# Patient Record
Sex: Male | Born: 1950 | Race: White | Hispanic: No | Marital: Married | Smoking: Never smoker
Health system: Southern US, Community
[De-identification: ages and names within clinical notes are randomized; demographics above are authoritative.]

## PROBLEM LIST (undated history)

## (undated) DIAGNOSIS — K635 Polyp of colon: Secondary | ICD-10-CM

## (undated) DIAGNOSIS — K5790 Diverticulosis of intestine, part unspecified, without perforation or abscess without bleeding: Secondary | ICD-10-CM

## (undated) DIAGNOSIS — D126 Benign neoplasm of colon, unspecified: Secondary | ICD-10-CM

## (undated) DIAGNOSIS — C801 Malignant (primary) neoplasm, unspecified: Secondary | ICD-10-CM

## (undated) DIAGNOSIS — Z85038 Personal history of other malignant neoplasm of large intestine: Principal | ICD-10-CM

## (undated) DIAGNOSIS — K648 Other hemorrhoids: Secondary | ICD-10-CM

## (undated) HISTORY — DX: Polyp of colon: K63.5

## (undated) HISTORY — PX: PILONIDAL CYST EXCISION: SHX744

## (undated) HISTORY — PX: UPPER GASTROINTESTINAL ENDOSCOPY: SHX188

## (undated) HISTORY — PX: PILONIDAL CYST / SINUS EXCISION: SUR543

## (undated) HISTORY — DX: Personal history of other malignant neoplasm of large intestine: Z85.038

## (undated) HISTORY — PX: APPENDECTOMY: SHX54

## (undated) HISTORY — DX: Malignant (primary) neoplasm, unspecified: C80.1

## (undated) HISTORY — DX: Diverticulosis of intestine, part unspecified, without perforation or abscess without bleeding: K57.90

## (undated) HISTORY — DX: Other hemorrhoids: K64.8

## (undated) HISTORY — PX: POLYPECTOMY: SHX149

## (undated) HISTORY — PX: TONSILLECTOMY AND ADENOIDECTOMY: SUR1326

## (undated) HISTORY — DX: Benign neoplasm of colon, unspecified: D12.6

---

## 1999-04-11 ENCOUNTER — Encounter: Payer: Self-pay | Admitting: Gastroenterology

## 1999-04-11 ENCOUNTER — Ambulatory Visit (HOSPITAL_COMMUNITY): Admission: RE | Admit: 1999-04-11 | Discharge: 1999-04-11 | Payer: Self-pay | Admitting: Gastroenterology

## 2004-11-02 HISTORY — PX: COLON RESECTION: SHX5231

## 2004-11-02 HISTORY — PX: COLONOSCOPY: SHX174

## 2005-01-13 ENCOUNTER — Ambulatory Visit: Payer: Self-pay | Admitting: Gastroenterology

## 2005-01-30 ENCOUNTER — Ambulatory Visit: Payer: Self-pay | Admitting: Gastroenterology

## 2005-02-03 ENCOUNTER — Ambulatory Visit: Payer: Self-pay | Admitting: Cardiology

## 2005-02-20 ENCOUNTER — Encounter (INDEPENDENT_AMBULATORY_CARE_PROVIDER_SITE_OTHER): Payer: Self-pay | Admitting: Specialist

## 2005-02-20 ENCOUNTER — Inpatient Hospital Stay (HOSPITAL_COMMUNITY): Admission: RE | Admit: 2005-02-20 | Discharge: 2005-02-24 | Payer: Self-pay | Admitting: General Surgery

## 2005-02-25 ENCOUNTER — Ambulatory Visit: Payer: Self-pay | Admitting: Oncology

## 2005-02-26 ENCOUNTER — Inpatient Hospital Stay (HOSPITAL_COMMUNITY): Admission: EM | Admit: 2005-02-26 | Discharge: 2005-02-28 | Payer: Self-pay | Admitting: Emergency Medicine

## 2005-03-25 ENCOUNTER — Ambulatory Visit (HOSPITAL_COMMUNITY): Admission: RE | Admit: 2005-03-25 | Discharge: 2005-03-25 | Payer: Self-pay | Admitting: General Surgery

## 2005-04-13 ENCOUNTER — Ambulatory Visit: Payer: Self-pay | Admitting: Oncology

## 2005-06-05 ENCOUNTER — Ambulatory Visit: Payer: Self-pay | Admitting: Oncology

## 2005-07-21 ENCOUNTER — Ambulatory Visit: Payer: Self-pay | Admitting: Oncology

## 2005-09-10 ENCOUNTER — Ambulatory Visit: Payer: Self-pay | Admitting: Oncology

## 2005-09-22 ENCOUNTER — Ambulatory Visit (HOSPITAL_COMMUNITY): Admission: RE | Admit: 2005-09-22 | Discharge: 2005-09-22 | Payer: Self-pay | Admitting: Oncology

## 2005-12-23 ENCOUNTER — Ambulatory Visit: Payer: Self-pay | Admitting: Gastroenterology

## 2006-01-06 ENCOUNTER — Ambulatory Visit: Payer: Self-pay | Admitting: Gastroenterology

## 2006-01-21 ENCOUNTER — Ambulatory Visit: Payer: Self-pay | Admitting: Oncology

## 2006-01-25 ENCOUNTER — Ambulatory Visit (HOSPITAL_COMMUNITY): Admission: RE | Admit: 2006-01-25 | Discharge: 2006-01-25 | Payer: Self-pay | Admitting: Oncology

## 2006-05-13 ENCOUNTER — Ambulatory Visit: Payer: Self-pay | Admitting: Oncology

## 2006-05-19 LAB — COMPREHENSIVE METABOLIC PANEL
AST: 19 U/L (ref 0–37)
Alkaline Phosphatase: 83 U/L (ref 39–117)
BUN: 13 mg/dL (ref 6–23)
Calcium: 9.2 mg/dL (ref 8.4–10.5)
Chloride: 104 mEq/L (ref 96–112)
Creatinine, Ser: 1.13 mg/dL (ref 0.40–1.50)
Total Bilirubin: 0.8 mg/dL (ref 0.3–1.2)

## 2006-05-19 LAB — LACTATE DEHYDROGENASE: LDH: 165 U/L (ref 94–250)

## 2006-05-19 LAB — CBC WITH DIFFERENTIAL/PLATELET
Basophils Absolute: 0 10*3/uL (ref 0.0–0.1)
EOS%: 1.1 % (ref 0.0–7.0)
HCT: 46.1 % (ref 38.7–49.9)
HGB: 15.9 g/dL (ref 13.0–17.1)
LYMPH%: 31.8 % (ref 14.0–48.0)
MCH: 29.9 pg (ref 28.0–33.4)
MCHC: 34.6 g/dL (ref 32.0–35.9)
MCV: 86.6 fL (ref 81.6–98.0)
MONO%: 8.9 % (ref 0.0–13.0)
NEUT%: 57.8 % (ref 40.0–75.0)
Platelets: 164 10*3/uL (ref 145–400)

## 2006-05-21 ENCOUNTER — Ambulatory Visit (HOSPITAL_COMMUNITY): Admission: RE | Admit: 2006-05-21 | Discharge: 2006-05-21 | Payer: Self-pay | Admitting: Oncology

## 2006-09-21 ENCOUNTER — Ambulatory Visit: Payer: Self-pay | Admitting: Oncology

## 2006-09-24 LAB — CBC WITH DIFFERENTIAL/PLATELET
Basophils Absolute: 0 10*3/uL (ref 0.0–0.1)
EOS%: 3.3 % (ref 0.0–7.0)
Eosinophils Absolute: 0.2 10*3/uL (ref 0.0–0.5)
HGB: 15.9 g/dL (ref 13.0–17.1)
MCH: 29.9 pg (ref 28.0–33.4)
NEUT#: 3.6 10*3/uL (ref 1.5–6.5)
RBC: 5.3 10*6/uL (ref 4.20–5.71)
RDW: 13.2 % (ref 11.2–14.6)
lymph#: 1.8 10*3/uL (ref 0.9–3.3)

## 2006-09-25 LAB — COMPREHENSIVE METABOLIC PANEL
AST: 20 U/L (ref 0–37)
Albumin: 4.5 g/dL (ref 3.5–5.2)
Alkaline Phosphatase: 87 U/L (ref 39–117)
BUN: 19 mg/dL (ref 6–23)
Calcium: 9.5 mg/dL (ref 8.4–10.5)
Chloride: 104 mEq/L (ref 96–112)
Potassium: 4.4 mEq/L (ref 3.5–5.3)
Sodium: 142 mEq/L (ref 135–145)
Total Protein: 6.7 g/dL (ref 6.0–8.3)

## 2006-09-27 ENCOUNTER — Ambulatory Visit (HOSPITAL_COMMUNITY): Admission: RE | Admit: 2006-09-27 | Discharge: 2006-09-27 | Payer: Self-pay | Admitting: Oncology

## 2006-12-30 ENCOUNTER — Ambulatory Visit: Payer: Self-pay | Admitting: Gastroenterology

## 2007-01-27 ENCOUNTER — Ambulatory Visit: Payer: Self-pay | Admitting: Oncology

## 2007-02-01 LAB — CBC WITH DIFFERENTIAL/PLATELET
BASO%: 1.5 % (ref 0.0–2.0)
Eosinophils Absolute: 0.1 10*3/uL (ref 0.0–0.5)
MONO#: 0.5 10*3/uL (ref 0.1–0.9)
NEUT#: 2.7 10*3/uL (ref 1.5–6.5)
Platelets: 168 10*3/uL (ref 145–400)
RBC: 5.5 10*6/uL (ref 4.20–5.71)
RDW: 13.7 % (ref 11.2–14.6)
WBC: 5.2 10*3/uL (ref 4.0–10.0)
lymph#: 1.9 10*3/uL (ref 0.9–3.3)

## 2007-02-01 LAB — CEA: CEA: <0.5 ng/mL (ref 0.0–5.0)

## 2007-02-01 LAB — COMPREHENSIVE METABOLIC PANEL
ALT: 26 U/L (ref 0–53)
Albumin: 4.6 g/dL (ref 3.5–5.2)
CO2: 27 mEq/L (ref 19–32)
Chloride: 103 mEq/L (ref 96–112)
Glucose, Bld: 106 mg/dL — ABNORMAL HIGH (ref 70–99)
Potassium: 4.2 mEq/L (ref 3.5–5.3)
Sodium: 141 mEq/L (ref 135–145)
Total Protein: 7.1 g/dL (ref 6.0–8.3)

## 2007-02-01 LAB — LACTATE DEHYDROGENASE: LDH: 161 U/L (ref 94–250)

## 2007-02-04 ENCOUNTER — Ambulatory Visit (HOSPITAL_COMMUNITY): Admission: RE | Admit: 2007-02-04 | Discharge: 2007-02-04 | Payer: Self-pay | Admitting: Oncology

## 2007-02-11 IMAGING — CR DG CHEST 2V
2 series · 2 of 2 positions shown · non-contrast
Comparison: None

CLINICAL DATA: Colon cancer, preop for colon resection

CHEST - 2 VIEW:

[view not recorded (1 of 2)]
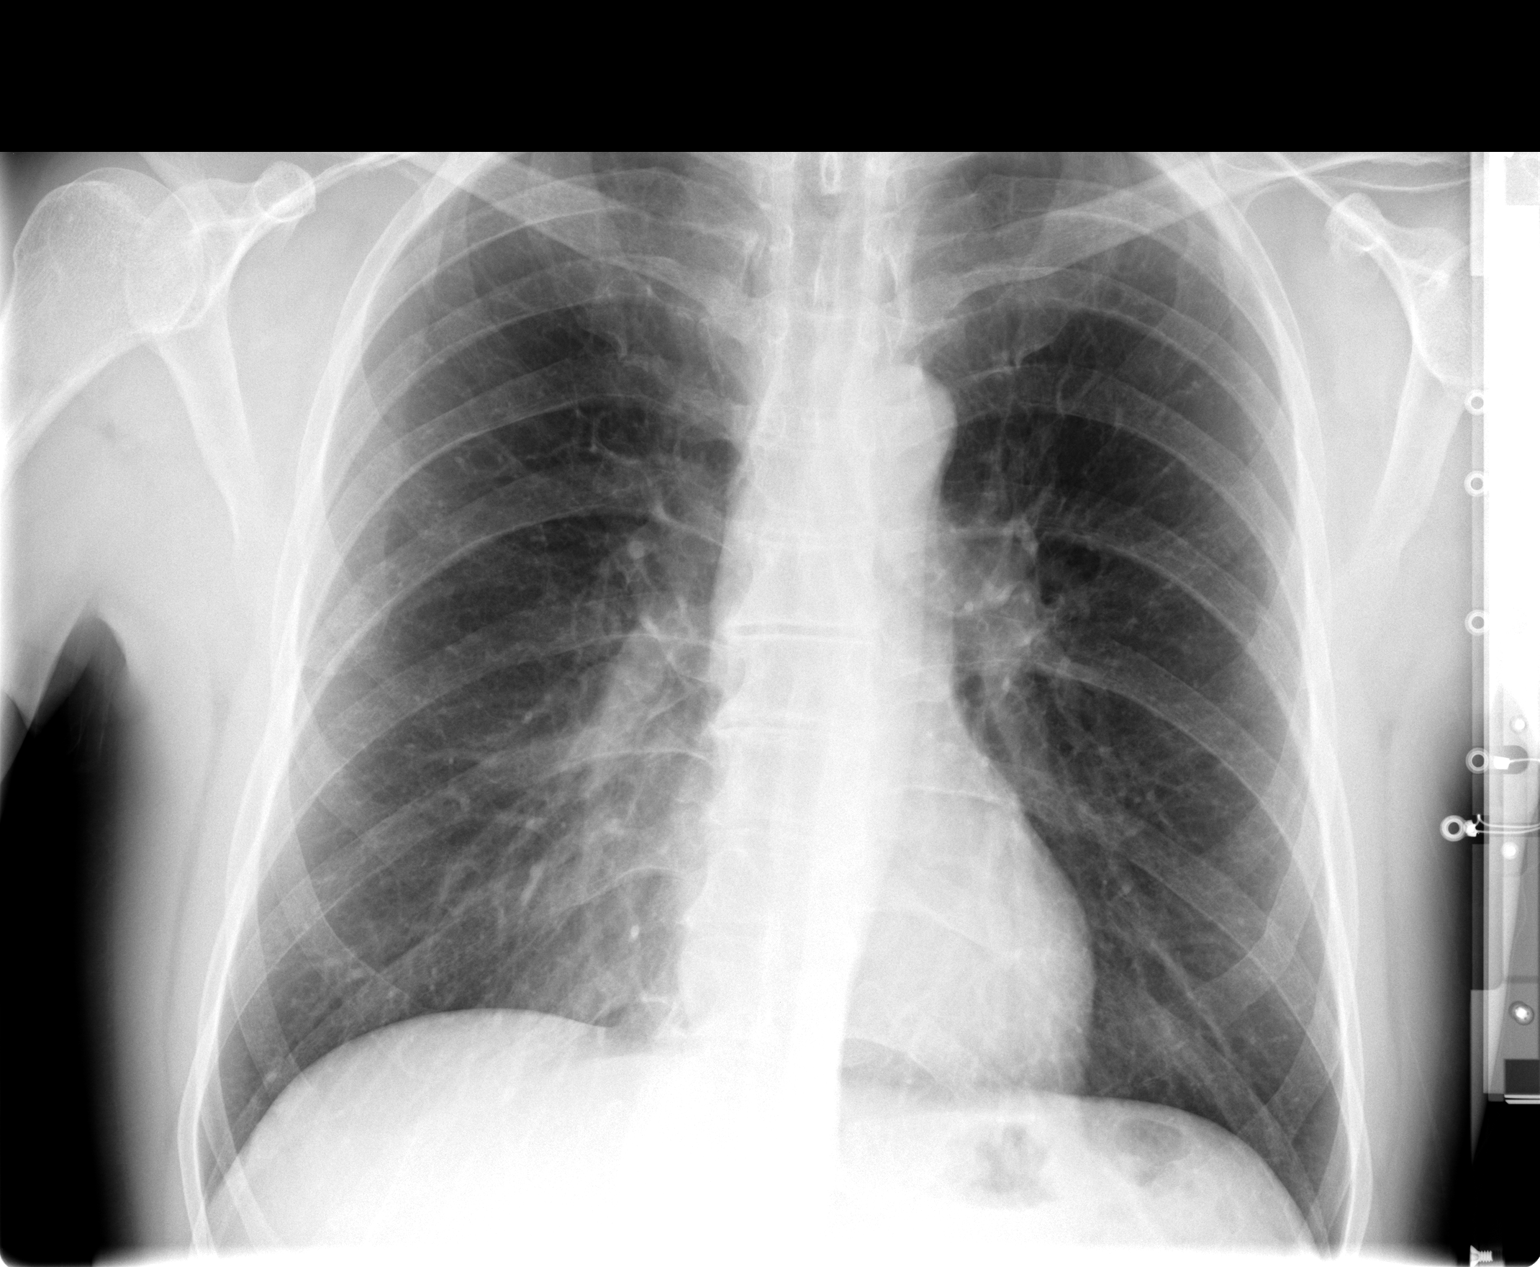

[view not recorded (2 of 2)]
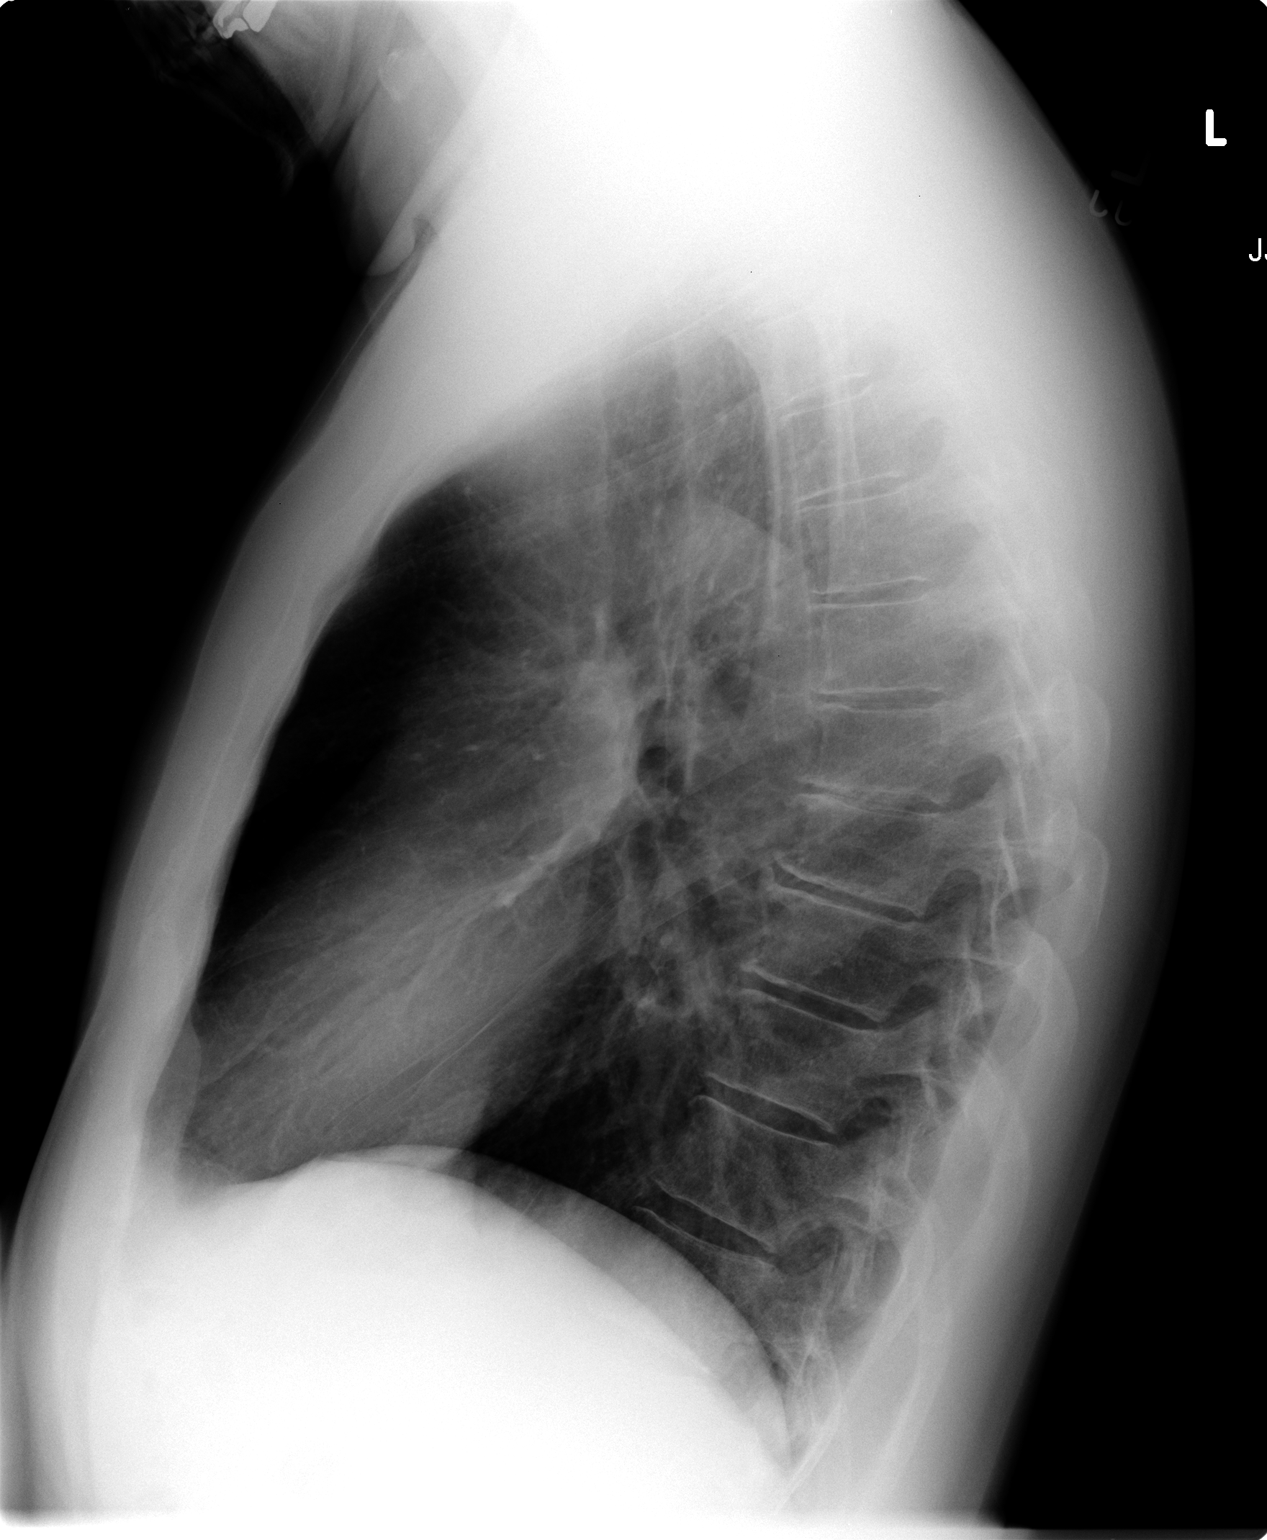

[2 of 2 positions shown; findings below may reference images not displayed]

FINDINGS: The heart size and mediastinal contours are within normal limits. 
Both lungs are clear.  The visualized skeletal structures are unremarkable.
IMPRESSION: No active cardiopulmonary disease

## 2007-06-02 ENCOUNTER — Ambulatory Visit: Payer: Self-pay | Admitting: Oncology

## 2007-06-06 LAB — CBC WITH DIFFERENTIAL/PLATELET
BASO%: 0.4 % (ref 0.0–2.0)
EOS%: 2.7 % (ref 0.0–7.0)
MCH: 30.3 pg (ref 28.0–33.4)
MCHC: 35.3 g/dL (ref 32.0–35.9)
MCV: 85.9 fL (ref 81.6–98.0)
MONO%: 8.6 % (ref 0.0–13.0)
NEUT%: 53.1 % (ref 40.0–75.0)
RDW: 13.6 % (ref 11.2–14.6)
lymph#: 1.8 10*3/uL (ref 0.9–3.3)

## 2007-06-06 LAB — COMPREHENSIVE METABOLIC PANEL
Albumin: 4.3 g/dL (ref 3.5–5.2)
BUN: 11 mg/dL (ref 6–23)
Calcium: 9.5 mg/dL (ref 8.4–10.5)
Chloride: 107 mEq/L (ref 96–112)
Creatinine, Ser: 1.02 mg/dL (ref 0.40–1.50)
Glucose, Bld: 99 mg/dL (ref 70–99)
Potassium: 3.9 mEq/L (ref 3.5–5.3)

## 2007-06-06 LAB — CEA: CEA: <0.5 ng/mL (ref 0.0–5.0)

## 2007-06-07 ENCOUNTER — Ambulatory Visit (HOSPITAL_COMMUNITY): Admission: RE | Admit: 2007-06-07 | Discharge: 2007-06-07 | Payer: Self-pay | Admitting: Oncology

## 2007-12-01 ENCOUNTER — Ambulatory Visit: Payer: Self-pay | Admitting: Oncology

## 2007-12-05 LAB — COMPREHENSIVE METABOLIC PANEL
ALT: 20 U/L (ref 0–53)
AST: 21 U/L (ref 0–37)
Albumin: 4.3 g/dL (ref 3.5–5.2)
Alkaline Phosphatase: 89 U/L (ref 39–117)
Calcium: 9.4 mg/dL (ref 8.4–10.5)
Glucose, Bld: 110 mg/dL — ABNORMAL HIGH (ref 70–99)
Potassium: 4.2 mEq/L (ref 3.5–5.3)
Sodium: 143 mEq/L (ref 135–145)
Total Bilirubin: 0.9 mg/dL (ref 0.3–1.2)

## 2007-12-05 LAB — CBC WITH DIFFERENTIAL/PLATELET
BASO%: 0.6 % (ref 0.0–2.0)
EOS%: 2.4 % (ref 0.0–7.0)
LYMPH%: 26 % (ref 14.0–48.0)
MCHC: 35.1 g/dL (ref 32.0–35.9)
MCV: 85 fL (ref 81.6–98.0)
MONO%: 7.1 % (ref 0.0–13.0)
Platelets: 156 10*3/uL (ref 145–400)
RBC: 5.45 10*6/uL (ref 4.20–5.71)
RDW: 13.1 % (ref 11.2–14.6)

## 2007-12-05 LAB — CEA: CEA: <0.5 ng/mL (ref 0.0–5.0)

## 2007-12-07 ENCOUNTER — Ambulatory Visit (HOSPITAL_COMMUNITY): Admission: RE | Admit: 2007-12-07 | Discharge: 2007-12-07 | Payer: Self-pay | Admitting: Oncology

## 2008-01-09 ENCOUNTER — Ambulatory Visit: Payer: Self-pay | Admitting: Gastroenterology

## 2008-01-23 ENCOUNTER — Encounter: Payer: Self-pay | Admitting: Gastroenterology

## 2008-01-23 ENCOUNTER — Ambulatory Visit: Payer: Self-pay | Admitting: Gastroenterology

## 2008-05-27 ENCOUNTER — Ambulatory Visit: Payer: Self-pay | Admitting: Oncology

## 2008-06-04 LAB — COMPREHENSIVE METABOLIC PANEL
ALT: 27 U/L (ref 0–53)
AST: 23 U/L (ref 0–37)
Albumin: 4.5 g/dL (ref 3.5–5.2)
Alkaline Phosphatase: 79 U/L (ref 39–117)
BUN: 13 mg/dL (ref 6–23)
Calcium: 9.2 mg/dL (ref 8.4–10.5)
Chloride: 104 mEq/L (ref 96–112)
Potassium: 4.2 mEq/L (ref 3.5–5.3)
Sodium: 139 mEq/L (ref 135–145)
Total Protein: 6.6 g/dL (ref 6.0–8.3)

## 2008-06-04 LAB — CBC WITH DIFFERENTIAL/PLATELET
BASO%: 0.6 % (ref 0.0–2.0)
Basophils Absolute: 0 10*3/uL (ref 0.0–0.1)
EOS%: 2.1 % (ref 0.0–7.0)
HGB: 16.4 g/dL (ref 13.0–17.1)
MCH: 30.1 pg (ref 28.0–33.4)
MONO%: 8.9 % (ref 0.0–13.0)
RBC: 5.44 10*6/uL (ref 4.20–5.71)
RDW: 13.4 % (ref 11.2–14.6)
lymph#: 1.8 10*3/uL (ref 0.9–3.3)

## 2008-06-07 ENCOUNTER — Ambulatory Visit (HOSPITAL_COMMUNITY): Admission: RE | Admit: 2008-06-07 | Discharge: 2008-06-07 | Payer: Self-pay | Admitting: Oncology

## 2008-06-12 ENCOUNTER — Encounter: Payer: Self-pay | Admitting: Gastroenterology

## 2008-11-30 ENCOUNTER — Ambulatory Visit: Payer: Self-pay | Admitting: Oncology

## 2008-12-04 ENCOUNTER — Ambulatory Visit (HOSPITAL_COMMUNITY): Admission: RE | Admit: 2008-12-04 | Discharge: 2008-12-04 | Payer: Self-pay | Admitting: Oncology

## 2008-12-04 LAB — COMPREHENSIVE METABOLIC PANEL
ALT: 35 U/L (ref 0–53)
AST: 29 U/L (ref 0–37)
Albumin: 4.2 g/dL (ref 3.5–5.2)
Alkaline Phosphatase: 79 U/L (ref 39–117)
Potassium: 4.3 mEq/L (ref 3.5–5.3)
Sodium: 141 mEq/L (ref 135–145)
Total Bilirubin: 1.4 mg/dL — ABNORMAL HIGH (ref 0.3–1.2)
Total Protein: 6.7 g/dL (ref 6.0–8.3)

## 2008-12-04 LAB — CBC WITH DIFFERENTIAL/PLATELET
BASO%: 0.5 % (ref 0.0–2.0)
EOS%: 3.5 % (ref 0.0–7.0)
LYMPH%: 32.7 % (ref 14.0–48.0)
MCHC: 35.2 g/dL (ref 32.0–35.9)
MCV: 85.9 fL (ref 81.6–98.0)
MONO%: 8 % (ref 0.0–13.0)
NEUT#: 2.5 10*3/uL (ref 1.5–6.5)
Platelets: 142 10*3/uL — ABNORMAL LOW (ref 145–400)
RBC: 5.61 10*6/uL (ref 4.20–5.71)
RDW: 13.2 % (ref 11.2–14.6)

## 2008-12-04 LAB — LACTATE DEHYDROGENASE: LDH: 148 U/L (ref 94–250)

## 2008-12-04 LAB — CEA: CEA: <0.5 ng/mL (ref 0.0–5.0)

## 2008-12-11 ENCOUNTER — Encounter: Payer: Self-pay | Admitting: Gastroenterology

## 2009-05-29 ENCOUNTER — Ambulatory Visit: Payer: Self-pay | Admitting: Oncology

## 2009-06-03 LAB — CBC WITH DIFFERENTIAL/PLATELET
BASO%: 0.1 % (ref 0.0–2.0)
Basophils Absolute: 0 10*3/uL (ref 0.0–0.1)
EOS%: 1.4 % (ref 0.0–7.0)
HCT: 45.7 % (ref 38.4–49.9)
HGB: 15.9 g/dL (ref 13.0–17.1)
MONO#: 0.4 10*3/uL (ref 0.1–0.9)
NEUT#: 4.7 10*3/uL (ref 1.5–6.5)
NEUT%: 71 % (ref 39.0–75.0)
RDW: 13.3 % (ref 11.0–14.6)
WBC: 6.7 10*3/uL (ref 4.0–10.3)
lymph#: 1.4 10*3/uL (ref 0.9–3.3)

## 2009-06-04 ENCOUNTER — Ambulatory Visit (HOSPITAL_COMMUNITY): Admission: RE | Admit: 2009-06-04 | Discharge: 2009-06-04 | Payer: Self-pay | Admitting: Oncology

## 2009-06-04 LAB — COMPREHENSIVE METABOLIC PANEL
ALT: 20 U/L (ref 0–53)
AST: 19 U/L (ref 0–37)
Albumin: 4.4 g/dL (ref 3.5–5.2)
BUN: 14 mg/dL (ref 6–23)
CO2: 23 mEq/L (ref 19–32)
Calcium: 9.4 mg/dL (ref 8.4–10.5)
Chloride: 105 mEq/L (ref 96–112)
Creatinine, Ser: 1.03 mg/dL (ref 0.40–1.50)
Potassium: 4.2 mEq/L (ref 3.5–5.3)

## 2009-06-04 LAB — LACTATE DEHYDROGENASE: LDH: 171 U/L (ref 94–250)

## 2009-06-11 ENCOUNTER — Encounter: Payer: Self-pay | Admitting: Gastroenterology

## 2009-11-29 ENCOUNTER — Ambulatory Visit: Payer: Self-pay | Admitting: Oncology

## 2009-12-03 ENCOUNTER — Ambulatory Visit (HOSPITAL_COMMUNITY): Admission: RE | Admit: 2009-12-03 | Discharge: 2009-12-03 | Payer: Self-pay | Admitting: Oncology

## 2009-12-03 LAB — COMPREHENSIVE METABOLIC PANEL
Alkaline Phosphatase: 88 U/L (ref 39–117)
BUN: 10 mg/dL (ref 6–23)
Glucose, Bld: 101 mg/dL — ABNORMAL HIGH (ref 70–99)
Total Bilirubin: 1.2 mg/dL (ref 0.3–1.2)

## 2009-12-03 LAB — CBC WITH DIFFERENTIAL/PLATELET
Basophils Absolute: 0 10*3/uL (ref 0.0–0.1)
Eosinophils Absolute: 0.2 10*3/uL (ref 0.0–0.5)
LYMPH%: 32 % (ref 14.0–49.0)
MCV: 88.4 fL (ref 79.3–98.0)
MONO%: 8.9 % (ref 0.0–14.0)
NEUT#: 2.9 10*3/uL (ref 1.5–6.5)
Platelets: 156 10*3/uL (ref 140–400)
RBC: 5.57 10*6/uL (ref 4.20–5.82)

## 2009-12-03 LAB — CEA: CEA: <0.5 ng/mL (ref 0.0–5.0)

## 2009-12-10 ENCOUNTER — Encounter: Payer: Self-pay | Admitting: Gastroenterology

## 2010-11-23 ENCOUNTER — Encounter: Payer: Self-pay | Admitting: Oncology

## 2010-11-28 ENCOUNTER — Ambulatory Visit: Payer: Self-pay | Admitting: Oncology

## 2010-12-02 LAB — COMPREHENSIVE METABOLIC PANEL
ALT: 21 U/L (ref 0–53)
AST: 26 U/L (ref 0–37)
Calcium: 9.2 mg/dL (ref 8.4–10.5)
Chloride: 101 mEq/L (ref 96–112)
Creatinine, Ser: 0.97 mg/dL (ref 0.40–1.50)
Potassium: 4.3 mEq/L (ref 3.5–5.3)
Sodium: 139 mEq/L (ref 135–145)
Total Protein: 6.4 g/dL (ref 6.0–8.3)

## 2010-12-02 LAB — CBC WITH DIFFERENTIAL/PLATELET
BASO%: 0.4 % (ref 0.0–2.0)
EOS%: 2.5 % (ref 0.0–7.0)
MCH: 29 pg (ref 27.2–33.4)
MCHC: 34.3 g/dL (ref 32.0–36.0)
NEUT%: 64.8 % (ref 39.0–75.0)
RBC: 5.38 10*6/uL (ref 4.20–5.82)
RDW: 13.7 % (ref 11.0–14.6)
WBC: 6.2 10*3/uL (ref 4.0–10.3)
lymph#: 1.6 10*3/uL (ref 0.9–3.3)

## 2010-12-02 NOTE — Letter (Signed)
[facility]   Imported By: Sherian Rein 01/13/2010 14:50:36  _____________________________________________________________________  External Attachment:    Type:   Image     Comment:   External Document

## 2010-12-09 ENCOUNTER — Encounter (HOSPITAL_BASED_OUTPATIENT_CLINIC_OR_DEPARTMENT_OTHER): Payer: BC Managed Care – PPO | Admitting: Oncology

## 2010-12-09 ENCOUNTER — Encounter: Payer: Self-pay | Admitting: Gastroenterology

## 2010-12-09 DIAGNOSIS — C180 Malignant neoplasm of cecum: Secondary | ICD-10-CM

## 2011-01-08 NOTE — Letter (Signed)
Summary: [facility]  [facility]   Imported By: Lennie Odor 01/02/2011 11:57:03  _____________________________________________________________________  External Attachment:    Type:   Image     Comment:   External Document

## 2011-03-20 NOTE — Discharge Summary (Signed)
NAMEJAMAAR, HOWES NO.:  [id number]   MEDICAL RECORD NO.:  [id number]          PATIENT TYPE:  INP   LOCATION:  0447                         FACILITY:  [facility]   PHYSICIAN:  Gita Kudo, M.D. DATE OF BIRTH:  Oct 28, 1951   DATE OF ADMISSION:  02/20/2005  DATE OF DISCHARGE:  02/24/2005                                 DISCHARGE SUMMARY   CHIEF COMPLAINT:  Colon cancer.   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  Other workup was negative, and he comes in for elective surgery.   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  Margins of resection negative.  His preoperative CBA was normal.  Hemoglobin 14, hematocrit 43, white count  6500.  CMET was normal.  Urinalysis was negative.  He was O negative and not  transfused.  The CEA value was 0.7.   HOSPITAL COURSE:  On the morning of admission, the patient underwent right  colectomy for a mobile carcinoma.  Postoperatively, he did well.  He was  seen in consultation by Dr. Cephas Darby for postoperative adjuvant  therapy consultation.  He slowly regained bowel function, was comfortable,  and accordingly after having had bowel movements on his fourth postoperative  day was allowed home.  He was allowed home on full liquid to regular diet as  tolerated.  He is to return to the office for followup.   DISCHARGE DIAGNOSIS:  Right colon invasive adenocarcinoma with three  positive lymph nodes.   PROCEDURE:  Right colectomy on February 20, 2005.   CONSULTATIONS:  Genene Churn. Cyndie Chime, M.D., oncology.   COMPLICATIONS/INFECTIONS:  None.      MRL/MEDQ  D:  03/03/2005  T:  03/03/2005  Job:  [id number]

## 2011-03-20 NOTE — H&P (Signed)
Justin Francis NO.:  [id number]   MEDICAL RECORD NO.:  [id number]          PATIENT TYPE:  INP   LOCATION:  0105                         FACILITY:  [facility]   PHYSICIAN:  Velora Heckler, MD      DATE OF BIRTH:  10/27/1951   DATE OF ADMISSION:  02/26/2005  DATE OF DISCHARGE:                                HISTORY & PHYSICAL   REASON FOR ADMISSION:  Partial small-bowel obstruction.   CHIEF COMPLAINT:  Nausea, vomiting, and abdominal pain.   HISTORY OF PRESENT ILLNESS:  The patient is a 60 year old white male who  underwent right colectomy for adenocarcinoma of the colon 6 days ago by Dr.  Jerelene Redden. The patient was discharged home 48 hours ago. The patient  developed increasing midabdominal pain. He developed nausea and vomiting. He  was unable to tolerate an oral diet. He returned to the emergency department  for assessment. The patient was noted on abdominal x-ray to have findings  consistent with partial small-bowel obstruction. General surgery was called  for admission and management.   PAST MEDICAL HISTORY:  Status post right colectomy 6 days ago at [facility].   MEDICATIONS:  None.   ALLERGIES:  To DERMATOLOGIC TOPICAL CREAMS only.   PHYSICAL EXAMINATION:  VITAL SIGNS:  Temperature 98.6, pulse 72,  respirations 18, blood pressure 128/76.  HEENT:  Shows him to be normocephalic, atraumatic. Sclerae clear,  conjunctivae clear. Pupils equal and reactive, dentition good, mucous  membranes moist.  NECK:  Supple without mass. There is no lymphadenopathy.  LUNGS:  Clear to auscultation bilaterally.  CARDIAC:  Shows regular rate and rhythm without murmur. Peripheral pulses  are full.  ABDOMEN:  Soft with mild distention. There is healing midline incision with  staples in place. There is mild ecchymosis but no erythema. There is mild  peri-incisional tenderness. There are no masses, there is no rebound. There  is mild voluntary  guarding.  EXTREMITIES:  Nontender without edema.  NEUROLOGIC:  The patient is alert and oriented without focal deficit.   LABORATORY STUDIES:  White count 9.1, hemoglobin 14.5. Electrolytes are  normal.   Radiographic studies:  Abdominal x-ray showing a few dilated loops of some  small bowel in the left upper quadrant consistent with partial small-bowel  obstruction. There is gas present within the colon.  Chest x-ray within  normal limits.   IMPRESSION:  Partial small-bowel obstruction less than 1 week following  right colectomy.   PLAN:  The patient will be admitted on the general surgical service. He will  receive intravenous hydration. I will maintain him on n.p.o. status for now.  We will repeat his abdominal x-ray in the morning. He will be followed up by  Dr. Jerelene Redden. Hopefully, this will resolve with medical management and  not require repeat laparotomy. Those issues were discussed with the patient  and his wife. They understand.      TMG/MEDQ  D:  02/26/2005  T:  02/26/2005  Job:  [id number]

## 2011-03-20 NOTE — Op Note (Signed)
NAMERUDRA, HOBBINS NO.:  [id number]   MEDICAL RECORD NO.:  [id number]          PATIENT TYPE:  INP   LOCATION:  0443                         FACILITY:  [facility]   PHYSICIAN:  Gita Kudo, M.D. DATE OF BIRTH:  Feb 29, 1951   DATE OF PROCEDURE:  02/20/2005  DATE OF DISCHARGE:                                 OPERATIVE REPORT   OPERATIVE PROCEDURE:  Right colon resection with primary anastomosis.   SURGEON:  Gita Kudo, M.D.   ASSISTANT:  Angelia Mould. Derrell Lolling, M.D.   ANESTHESIA:  General endotracheal.   PREOPERATIVE DIAGNOSIS:  Carcinoma right colon - cecum.   POSTOPERATIVE DIAGNOSES:  Carcinoma right colon - cecum.   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 Preop CEA is normal, abdominal CT is negative for  metastatic disease - liver had multiple small benign appearing cysts. After  bowel prep as an outpatient, he comes in for surgery.   OPERATIVE FINDINGS:  The patient had a moderately bulky tumor of the cecum.  It was quite mobile, not stuck to anything. There are multiple lymph nodes  in the mesentery and I could not determine whether they were malignant but I  got a wide resection of the tumor as well as the lymph nodes. Manual  examination of the abdomen showed some diverticular changes in the sigmoid  colon, some small palpable cystic areas in the liver. Normal stomach,  gallbladder, small bowel. The omentum was normal without any nodules, no  fluid in the abdomen and the aorta and great vessels felt normal. The  spermatic vessels and ureter on the right side felt safely out of the way  and not injured.   OPERATIVE PROCEDURE:  Under satisfactory general endotracheal anesthesia,  having undergone outpatient bowel prep IV  cefoxitin, Foley catheter and  nasogastric tube placement; the patient was positioned, prepped  and draped  in the standard fashion. A midline incision was made which was extended  later to examine the liver. Bleeders were coagulated and then manual and  visual laparotomy performed. Following this, a curative resection was  performed. The lateral reflections of the distal ileum and right colon were  divided by cautery or between clamps and ties of silk. The hepatic flexure  was taken down between clamps and tied with silk. Then the omentum was freed  from the lateral fourth of the right transverse colon. At this point, the  colon was freed and transected with the GIA stapler. Likewise the distal  ileum was transected with the GIA stapler. An incision made with the cautery  into the peritoneal covering of the mesentery. The mesentery was then  divided with clamps and ties of 2-0 silk removing a wide portion and up to  the superior mesenteric vessels but not damaging them. The specimen was then  removed and sent for pathology. An anastomosis was performed.   The ileum and transverse colon were lined up with 3-0 silk suture. A side to  side  GIA stapled anastomosis was then performed. The stab wounds were then  closed with interrupted 3-0 silk Gambee sutures. A second 3-0 silk suture  was used to run over the closure and then gloves were changed. A another  silk suture was placed at the crotch of the anastomosis and then the  mesentery closed with interrupted figure-of-eight 3-0 silk sutures.   When finished, the area was hemostatic, lavaged with saline. The anastomosis  had a good blood supply, lay without tension and was widely patent. After  correct sponge and needle counts were done, the abdomen closed with a  running double-stranded #1 PDS suture and skin staples. Sterile absorbent  dressing applied and the patient went to the recovery room from the  operating room in good condition without complication.      MRL/MEDQ  D:  02/20/2005  T:  02/20/2005  Job:  [id number]   cc:   Vania Rea. Jarold Motto, M.D. [facility]   Vikki Ports, M.D.  [address]  [place]  [place] [address]  Fax: [phone]

## 2011-03-20 NOTE — Op Note (Signed)
NAMESABAN, HEINLEN NO.:  [id number]   MEDICAL RECORD NO.:  [id number]          PATIENT TYPE:  AMB   LOCATION:  DAY                          FACILITY:  [facility]   PHYSICIAN:  Gita Kudo, M.D. DATE OF BIRTH:  Mar 17, 1951   DATE OF PROCEDURE:  03/25/2005  DATE OF DISCHARGE:                                 OPERATIVE REPORT   OPERATIVE PROCEDURE:  Placement indwelling venous access system (Port-A-  Cath).   SURGEON:  Dr. Jerelene Redden.   ANESTHESIA:  MAC -  local Xylocaine and Marcaine with intravenous sedation.   PREOPERATIVE DIAGNOSIS:  Status post colectomy for cancer, needs  chemotherapy and venous access for administration.   POSTOPERATIVE DIAGNOSIS:  Status post colectomy for cancer, needs  chemotherapy and venous access for administration.   OPERATIVE PROCEDURE:  The patient received 1.0 g Ancef preop and was  prepped, draped and positioned in the standard fashion. Intravenous sedation  was given and local anesthetic infiltration for good analgesia. A right  subclavian puncture was made, J-wire inserted and fluoroscopy showed it to  be in good position. Then a subcutaneous pocket was made under the right  clavicle over the pectoralis muscle to accommodate the reservoir. The J-wire  site was enlarged and a subcutaneous tunnel made from there to the pocket  and the catheter brought through that. The catheter was cut to the  appropriate length, hooked to the reservoir, secured and flushed with  heparin. The reservoir was then sewn to the pectoralis fascia with two 2-0  nylon sutures. Then the dilator was placed over the J-wire and introducer  and inserted. Fluoroscopy showed the catheter was at the correct location  and length and that the dilator was in good position. Then the J-wire and  dilator were withdrawn, leaving the introducer in place. The catheter was  then placed into the introducer which was peeled away, leaving the catheter  in excellent  position. This was confirmed by blood aspiration as well as  fluoroscopy.  Then, the system was flushed with  concentrated heparin and the wounds closed in layers with 3-0 and 4-0  Vicryl. Steri-Strips applied and sterile dressing.  He will go to the PACU,  have a chest x-ray obtained and then present to Dr. Cyndie Chime next week  for chemotherapy. X-ray will notify the cancer center of the placement of  the catheter.      MRL/MEDQ  D:  03/25/2005  T:  03/25/2005  Job:  [id number]   cc:   Genene Churn. Cyndie Chime, M.D.  [address] [facility]  [place]  [place] [address]  Fax: [phone]   Vikki Ports, M.D.  [address]  [place]  [place] [address]  Fax: [phone]   Vania Rea. Jarold Motto, M.D. [facility]

## 2011-03-20 NOTE — Assessment & Plan Note (Signed)
Justin Francis                         GASTROENTEROLOGY OFFICE NOTE   Justin Francis, Justin Francis                        MRN:          [id number]  DATE:12/30/2006                            DOB:          1950/11/27    Justin Francis is a 60 year old white male who has had colon carcinoma with a  previous resection.  He had follow up colonoscopy in March of 2007.  He  is followed by Dr. Cyndie Chime in Oncology.   He has had some intermittent rectal bleeding over the last several  months and previous saw Dr. Theresia Lo and responded to some Proctocort  Cream.  He does have mild constipation, has recently increased the fiber  in his diet and seems to be doing better.  He denies abdominal pain or  systemic complaints.  He specifically denies upper GI or hepatobiliary  problems.  He is really not on any medications at all at this point.   EXAM:  Shows him to be a healthy appearing white male in no distress.  He weighs 185 pounds and blood pressure is 130/68.  Pulse was 80 and  regular.  I could not appreciate hepatosplenomegaly, abdominal masses or  tenderness.  Bowel sounds were normal.  Inspection of the rectum was  unremarkable as was rectal exam.  I could not feel any rectal masses or  tenderness.  Stool was guaiac negative.   ASSESSMENT:  Justin Francis most likely has some asymptomatic internal  hemorrhoidal bleeding related to his constipation.   RECOMMENDATIONS:  1. High fiber diet and daily Konsyl liberal p.o. fluids.  2. Canasa suppositories for 2 weeks at bedtime.  3. Consider flexible sigmoid exam if symptoms continue.     Vania Rea. Jarold Motto, MD, Caleen Essex, FAGA  Electronically Signed    DRP/MedQ  DD: 12/30/2006  DT: 12/30/2006  Job #: [id number]   cc:   Vikki Ports, M.D.  Genene Churn. Cyndie Chime, M.D.

## 2011-03-20 NOTE — Consult Note (Signed)
NAMEIVAN, LACHER NO.:  [id number]   MEDICAL RECORD NO.:  [id number]          PATIENT TYPE:  INP   LOCATION:  0447                         FACILITY:  [facility]   PHYSICIAN:  Genene Churn. [place], M.D.DATE OF BIRTH:  1951/03/22   DATE OF CONSULTATION:  02/24/2005  DATE OF DISCHARGE:                                   CONSULTATION   REASON FOR CONSULTATION:  This is a medical oncology consultation to  evaluate this man for newly-diagnosed stage III colon cancer.   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 He has type O blood and has been donating blood to the [employer] [facility] every 8 weeks for many years. At time of most recent donation in March  2006, he was found to be anemic. He was initially evaluated by his family  physician. He was told his hemoglobin was about 9 grams. He had been  experiencing some intermittent, vague, periumbilical abdominal discomfort  for about 3 months. No anorexia, no fatigue, no change in bowel habit, no  hematochezia or melena. He was referred to a gastroenterologist and  underwent a colonoscopy on January 30, 2005. He was found to have a mass in  the cecum and biopsies diagnostic for adenocarcinoma. A CT scan of the  abdomen and pelvis done at [facility] on February 03, 2005 was normal.  The cecum was well opacified and the wall was somewhat thickened. Liver  normal. Incidental low attenuation lesion in the central left adrenal gland  probably representing an adenoma, measuring 10 x 17 mm.   He was referred to Dr. Maryagnes Amos. He underwent an exploratory laparotomy with a  right hemicolectomy on February 20, 2005. Liver was examined and found to be  clear of tumor. He was found to have a large cecal mass which was mobile. A  right hemicolectomy and lymph node dissection was performed. Pathology shows  a 6.2 cm grade II  moderately differentiated adenocarcinoma. All surgical  margins are clean. Tumor penetrates through the full thickness of the bowel  wall into the pericolonic adipose tissue. There is vascular and lymphatic  invasion. Three of 37 lymph nodes contained tumor (pathological stage T3 N1  M0 stage III).   The patient had a problem with rectal fissures about 6 years ago and had  minor surgery by Dr. Kendrick Ranch. A colonoscopy was done at that time and  reported to the patient as normal. No subsequent studies done.   There is no family history of any malignancy; specifically, no history of GI  malignancy.   PAST MEDICAL HISTORY:  Rectal fissures. Removal of a pilonidal cyst in high  school. No other medical or surgical illness. No chronic medications. No  allergies.   FAMILY HISTORY:  Mother alive in her mid 26s, had breast cancer removed 30  years ago. Father died at age 36 of a self-inflicted gunshot wound. He has  one younger brother 3 years younger who is healthy.   SOCIAL  HISTORY:  Married, two sons - one in the eight grade, one a sophomore  in college - both healthy. Never smoked, no alcohol. No toxic or chemical  exposures.   PHYSICAL EXAMINATION:  Deferred at this time.   PERTINENT LABORATORY ON ADMISSION:  The hemoglobin was 14 with hematocrit  42.6; MCV 72 (of note, he did not receive a blood transfusion but was put on  iron when he was found to be anemic in March); white count 6500; 74%  neutrophils; platelets 216,000. BUN 11, creatinine 1.0, SGOT 22, SGPT 24,  alkaline phosphatase 73, bilirubin 0.7. Preop CEA done on February 18, 2005 was  normal at 0.7. Urinalysis normal. Blood type A, Rh negative.  Electrocardiogram normal. Chest x-ray normal.   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   In view of his young age and excellent health, he is definitely a candidate  for aggressive adjuvant chemotherapy. Standard program at this  point in time  would include a combination of 5-flurouracil/leucovorin with either  oxaliplatinum or irinotecan. Addition of biological agents, including  Erbitux or Avastin, is currently being studied in clinical trials, having  already shown improved results in the metastatic setting.   The diagnosis, prognosis, and available treatment options discussed at  length with the patient and his wife today. I will arrange for outpatient  follow-up to determine the best adjuvant program that he fits into including  the possibility of going on a current clinical trial.   Thank you for this consultation.      JMG/MEDQ  D:  02/24/2005  T:  02/24/2005  Job:  [id number]   cc:   Gita Kudo, M.D.  [address]  [place]  [place] [address]   Vikki Ports, M.D.  [address]  [place]  [place] [address]  Fax: [phone]   Vania Rea. Jarold Motto, M.D. [facility]   Timothy E. Earlene Plater, M.D.  [address] [place]  [place] [address]

## 2011-04-06 ENCOUNTER — Ambulatory Visit (AMBULATORY_SURGERY_CENTER): Payer: BC Managed Care – PPO | Admitting: *Deleted

## 2011-04-06 VITALS — Ht 71.0 in | Wt 190.1 lb

## 2011-04-06 DIAGNOSIS — Z85038 Personal history of other malignant neoplasm of large intestine: Secondary | ICD-10-CM

## 2011-04-06 MED ORDER — PEG-KCL-NACL-NASULF-NA ASC-C 100 G PO SOLR: ORAL | Status: DC

## 2011-04-20 ENCOUNTER — Ambulatory Visit (AMBULATORY_SURGERY_CENTER): Payer: BC Managed Care – PPO | Admitting: Gastroenterology

## 2011-04-20 ENCOUNTER — Encounter: Payer: Self-pay | Admitting: Gastroenterology

## 2011-04-20 VITALS — BP 125/79 | HR 71 | Temp 97.7°F | Resp 20 | Ht 71.0 in | Wt 182.0 lb

## 2011-04-20 DIAGNOSIS — K5730 Diverticulosis of large intestine without perforation or abscess without bleeding: Secondary | ICD-10-CM

## 2011-04-20 DIAGNOSIS — Z85038 Personal history of other malignant neoplasm of large intestine: Secondary | ICD-10-CM

## 2011-04-20 MED ORDER — SODIUM CHLORIDE 0.9 % IV SOLN: 500.0000 mL | INTRAVENOUS | Status: DC

## 2011-04-20 NOTE — Progress Notes (Signed)
No complaints on discharge.  MAW 

## 2011-04-20 NOTE — Patient Instructions (Signed)
Refer to the picture page for your findings today.  Please follow the discharge instructions on the blue and green papers today.  Resume your prior medications today.  Call if any questions or concerns.

## 2011-04-21 ENCOUNTER — Telehealth: Payer: Self-pay | Admitting: *Deleted

## 2011-04-21 NOTE — Telephone Encounter (Signed)
No id on machine; no message left 

## 2011-11-21 ENCOUNTER — Telehealth: Payer: Self-pay | Admitting: Oncology

## 2011-11-21 NOTE — Telephone Encounter (Signed)
CALLED [facility] HOME S/W WIFE AND PROVIDED  APPTS FOR [id number]

## 2011-11-26 IMAGING — CT CT ABD-PELV W/ CM
2 of 5 series · 17 of 46 positions shown, 19 images · IV contrast (agent unspecified)
Comparison: 06/04/2009

CLINICAL DATA: Follow-up metastatic colon carcinoma.  Status post
surgery chemotherapy.

CT ABDOMEN AND PELVIS WITH CONTRAST
TECHNIQUE: Multidetector CT imaging of the abdomen and pelvis was
performed following the standard protocol during bolus
administration of intravenous contrast.
Contrast: 100 ml Lmnipaque-UHH and oral contrast

[Series 2: rtn a/p with · axial · 0.76mm/px · z∈[+1242,+1622]mm · 14 of 88 slices shown, 16 images]
[im 6/88  soft-tissue]
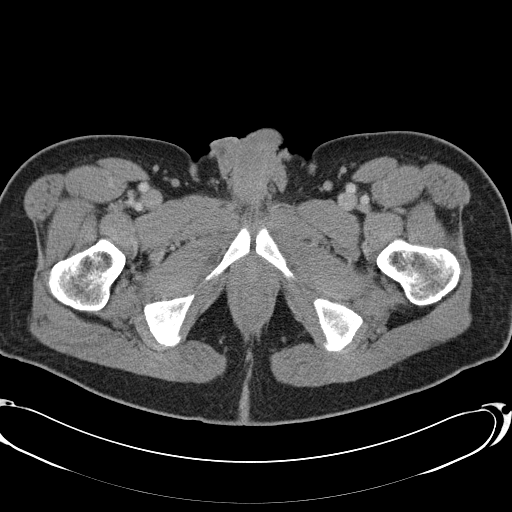
[im 6/88  bone]
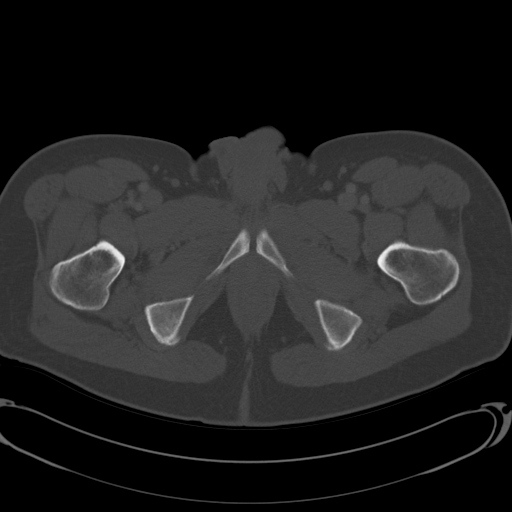
[im 11/88  soft-tissue]
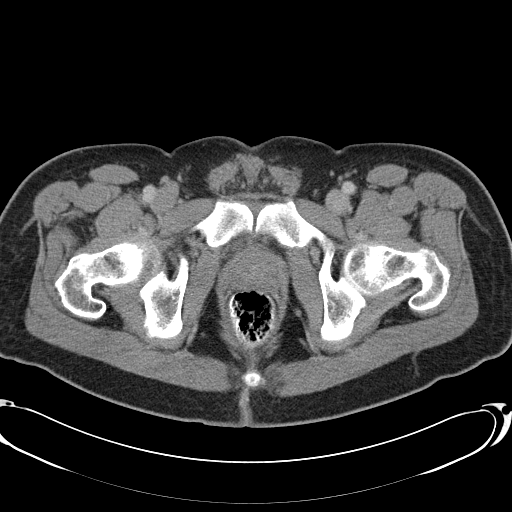
[im 16/88  soft-tissue]
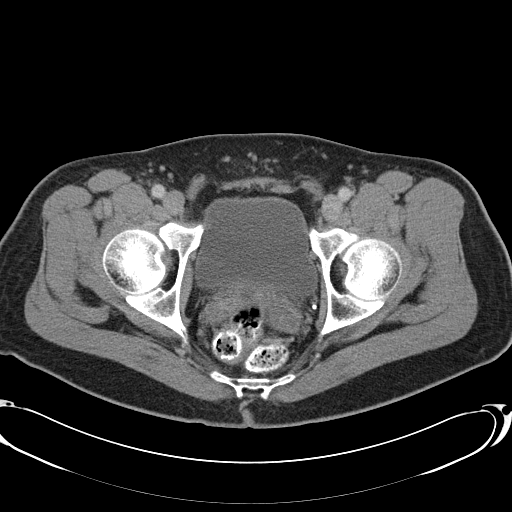
[im 26/88  soft-tissue]
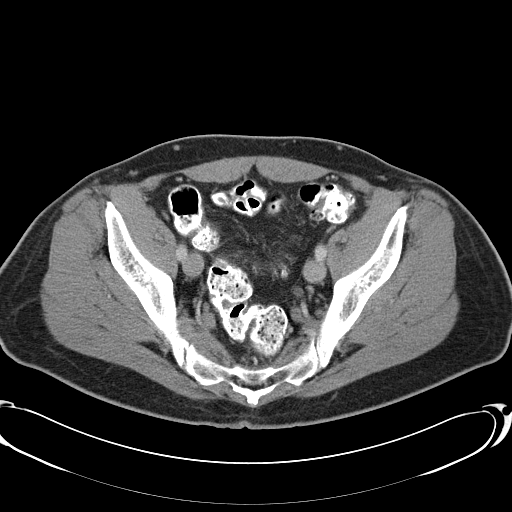
[im 31/88  soft-tissue]
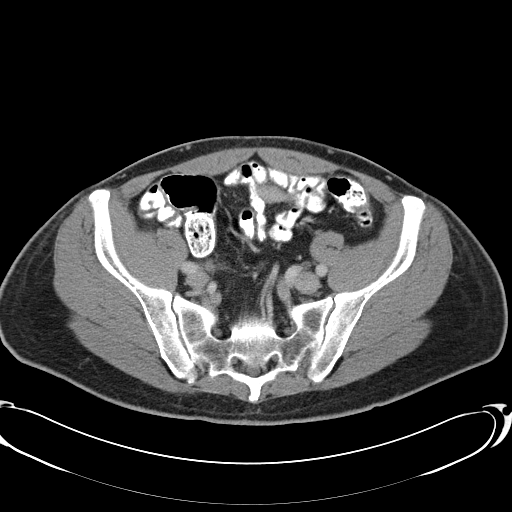
[im 36/88  soft-tissue]
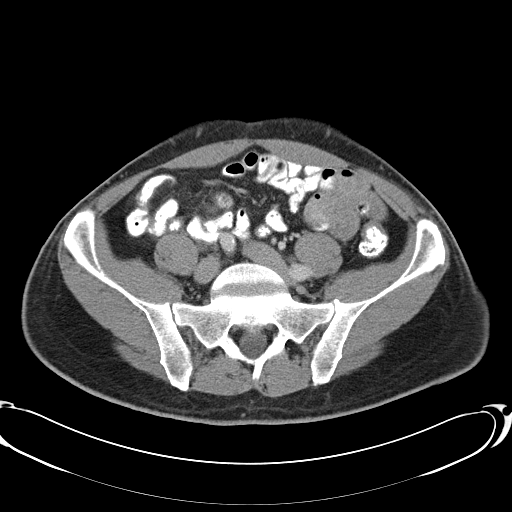
[im 41/88  soft-tissue]
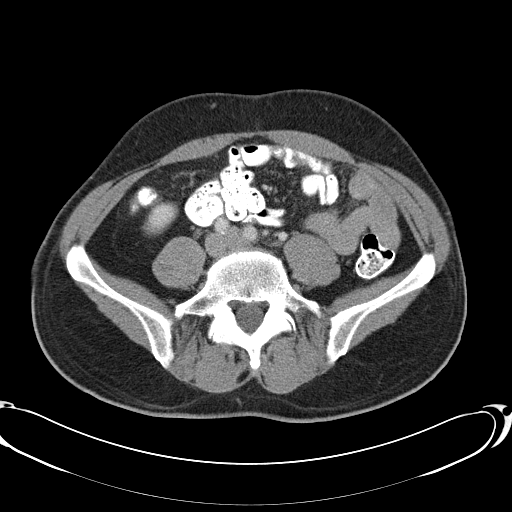
[im 47/88  soft-tissue]
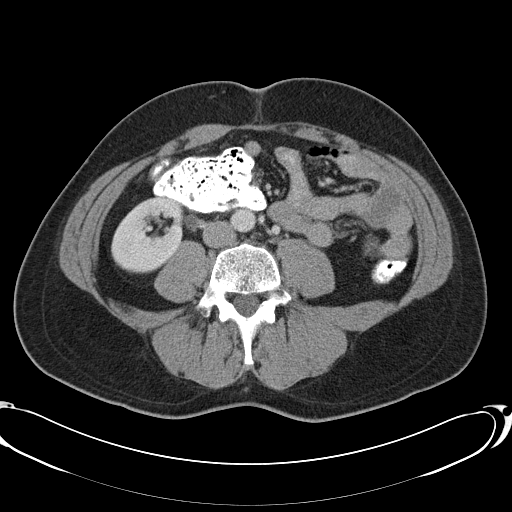
[im 52/88  soft-tissue]
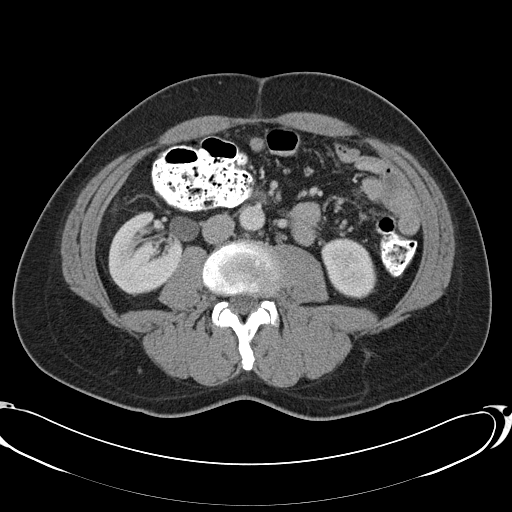
[im 52/88  bone]
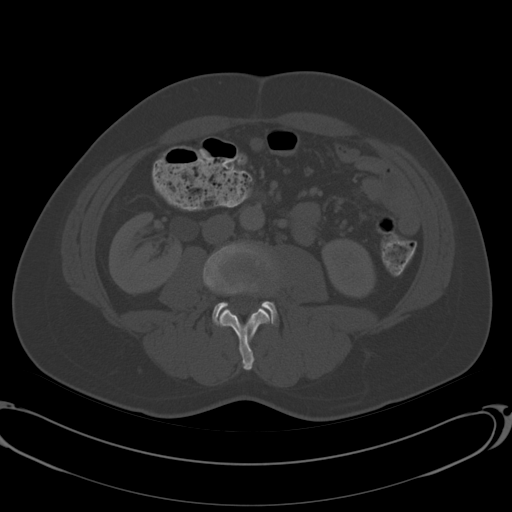
[im 57/88  soft-tissue]
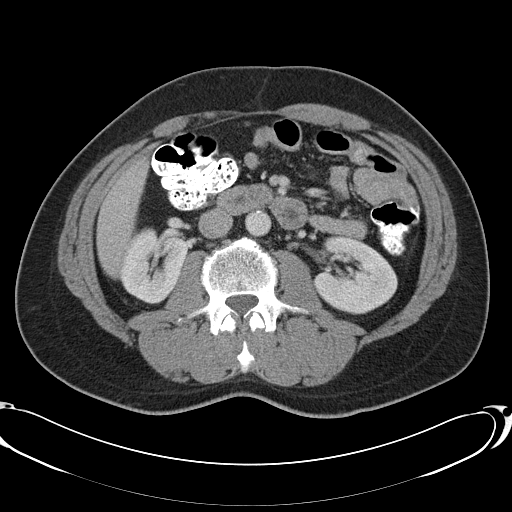
[im 67/88  soft-tissue]
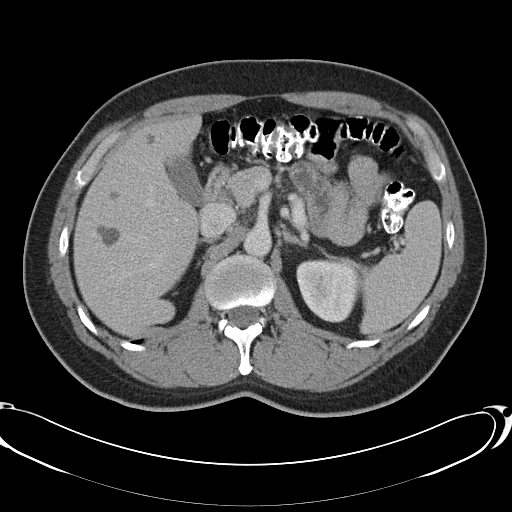
[im 72/88  soft-tissue]
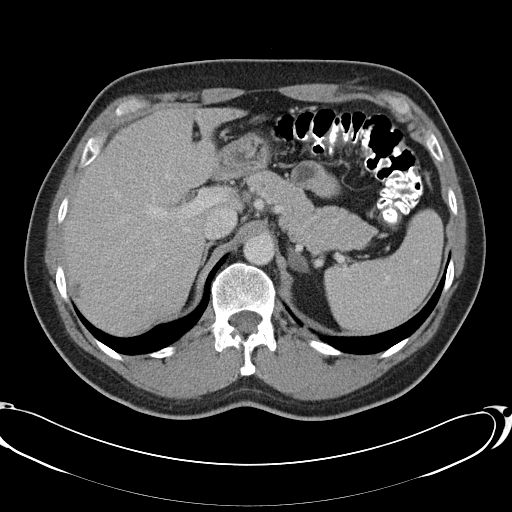
[im 77/88  soft-tissue]
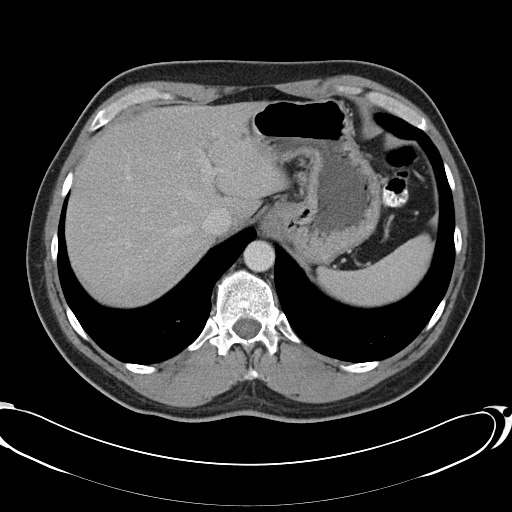
[im 82/88  soft-tissue]
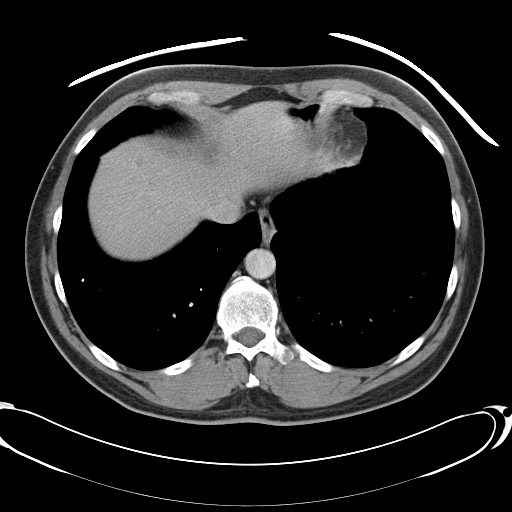

[Series 602: <mpr thick range> · coronal · 0.85mm/px · 3 of 89 slices shown]
[im 30/89  soft-tissue]
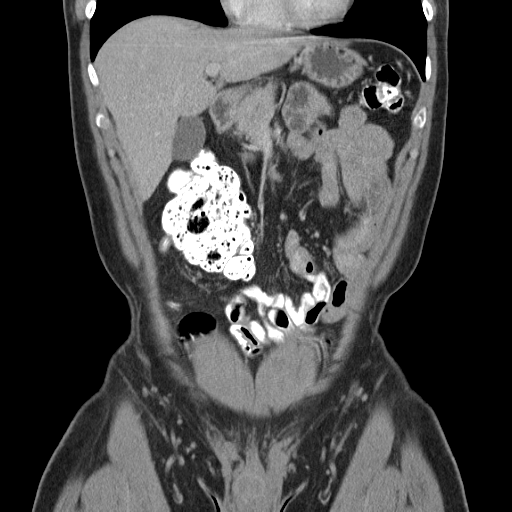
[im 40/89  soft-tissue]
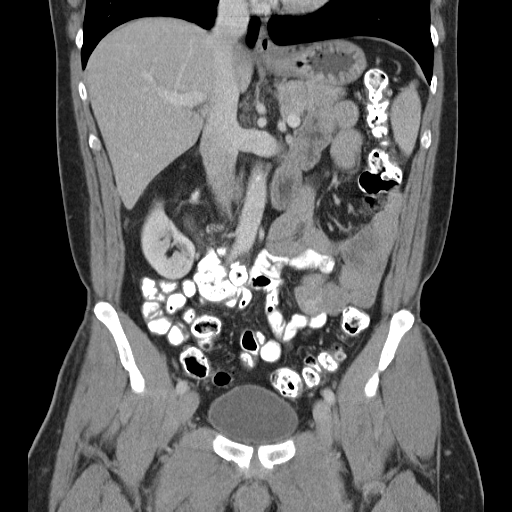
[im 49/89  soft-tissue]
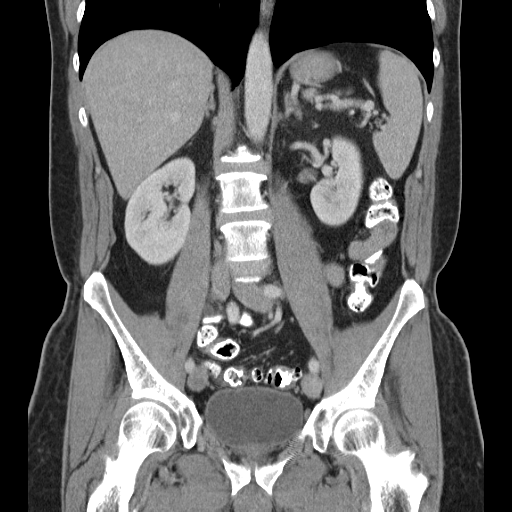

[17 of 46 positions shown; findings below may reference images not displayed]

FINDINGS: Multiple small hepatic cysts remain stable.  No liver
masses are identified.  A small low attenuation left adrenal mass
measuring 1.7 cm is also stable and consistent with a benign
adrenal adenoma.  The spleen, pancreas, and kidneys are
unremarkable in appearance.

No soft tissue masses or adenopathy identified within the abdomen
or pelvis.  A small prostatic utricle cyst is stable.
Diverticulosis is again seen involve the sigmoid colon.  There is
no evidence of diverticulitis or other inflammatory process within
the abdomen or pelvis.  No abnormal fluid collections are seen.
IMPRESSION: Stable exam.  No evidence of recurrent or metastatic disease, or
other acute findings.

## 2011-12-01 ENCOUNTER — Other Ambulatory Visit: Payer: BC Managed Care – PPO | Admitting: Lab

## 2011-12-14 ENCOUNTER — Other Ambulatory Visit (HOSPITAL_BASED_OUTPATIENT_CLINIC_OR_DEPARTMENT_OTHER): Payer: BC Managed Care – PPO | Admitting: Lab

## 2011-12-14 DIAGNOSIS — C180 Malignant neoplasm of cecum: Secondary | ICD-10-CM

## 2011-12-14 LAB — COMPREHENSIVE METABOLIC PANEL
ALT: 16 U/L (ref 0–53)
Albumin: 4 g/dL (ref 3.5–5.2)
CO2: 27 mEq/L (ref 19–32)
Calcium: 9.5 mg/dL (ref 8.4–10.5)
Chloride: 104 mEq/L (ref 96–112)
Potassium: 4.3 mEq/L (ref 3.5–5.3)
Sodium: 140 mEq/L (ref 135–145)
Total Bilirubin: 0.6 mg/dL (ref 0.3–1.2)
Total Protein: 6.5 g/dL (ref 6.0–8.3)

## 2011-12-14 LAB — CBC WITH DIFFERENTIAL/PLATELET
BASO%: 0.2 % (ref 0.0–2.0)
MCHC: 33.7 g/dL (ref 32.0–36.0)
MONO#: 0.9 10*3/uL (ref 0.1–0.9)
NEUT#: 9.3 10*3/uL — ABNORMAL HIGH (ref 1.5–6.5)
RBC: 5.2 10*6/uL (ref 4.20–5.82)
RDW: 14 % (ref 11.0–14.6)
WBC: 12.1 10*3/uL — ABNORMAL HIGH (ref 4.0–10.3)
lymph#: 1.7 10*3/uL (ref 0.9–3.3)

## 2011-12-14 LAB — LACTATE DEHYDROGENASE: LDH: 165 U/L (ref 94–250)

## 2011-12-21 ENCOUNTER — Telehealth: Payer: Self-pay | Admitting: Oncology

## 2011-12-21 ENCOUNTER — Ambulatory Visit (HOSPITAL_BASED_OUTPATIENT_CLINIC_OR_DEPARTMENT_OTHER): Payer: BC Managed Care – PPO | Admitting: Oncology

## 2011-12-21 ENCOUNTER — Encounter: Payer: Self-pay | Admitting: Oncology

## 2011-12-21 DIAGNOSIS — Z85038 Personal history of other malignant neoplasm of large intestine: Secondary | ICD-10-CM

## 2011-12-21 HISTORY — DX: Personal history of other malignant neoplasm of large intestine: Z85.038

## 2011-12-21 NOTE — Telephone Encounter (Signed)
appt made and printed for 12/19/12  aom

## 2011-12-21 NOTE — Progress Notes (Signed)
Hematology and Oncology Follow Up Visit  Justin Francis [id number] [birth year] 61 y.o. 12/21/2011 9:25 PM   Principle Diagnosis: Encounter Diagnosis  Name Primary?  . 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  Three nodes positive.  Normal preoperative CEA.  He was treated with surgical resection followed by 6 months of adjuvant FOLFIRI chemotherapy. He has had no signs of recurrence to date. He was followed with exams lab and CT scans for the first 5 years and currently just exams and lab. He continues to do well. He has had no interim medical problems. Most recent colonoscopy done about 1 year ago on 04/20/2011 unremarkable. He denies any abdominal pain or cramps no change in bowel habit no hematochezia or melena.  Medications: reviewed  Allergies:  Allergies  Allergen Reactions  . Leucovorin Hives  . Formaldehyde Itching and Rash    Review of Systems: Constitutional:   No constitutional symptoms Respiratory: No cough or dyspnea he is just getting over a recent bronchitis Cardiovascular:  No chest pain or palpitations Gastrointestinal: See above Genito-Urinary: No urinary tract symptoms Musculoskeletal: No bone pain Neurologic: No headache or change in vision Skin: No skin lesions Remaining ROS negative.  Physical Exam: There were no vitals taken for this visit. Wt Readings from Last 3 Encounters:  04/20/11 182 lb (82.555 kg)  04/06/11 190 lb 1.6 oz (86.229 kg)     General appearance: A thin Caucasian man HENNT: Pharynx no erythema or exudate Lymph nodes: No cervical supraclavicular axillary or inguinal adenopathy Breasts: Lungs: Clear to auscultation resonant to percussion Heart: Regular cardiac rhythm no murmur Abdomen: Soft nontender no mass no organomegaly Extremities: No edema no calf tenderness Vascular:  No cyanosis Neurologic: Motor strength 5 over 5 reflexes 2+ symmetric Skin: No rash or ecchymosis  Lab Results: Lab Results  Component Value Date   WBC 12.1* 12/14/2011   HGB 14.8 12/14/2011   HCT 44.0 12/14/2011   MCV 84.6 12/14/2011   PLT 178 12/14/2011     Chemistry      Component Value Date/Time   NA 140 12/14/2011 0813   K 4.3 12/14/2011 0813   CL 104 12/14/2011 0813   CO2 27 12/14/2011 0813   BUN 11 12/14/2011 0813   CREATININE 0.99 12/14/2011 0813      Component Value Date/Time   CALCIUM 9.5 12/14/2011 0813   ALKPHOS 107 12/14/2011 0813   AST 20 12/14/2011 0813   ALT 16 12/14/2011 0813   BILITOT 0.6 12/14/2011 0813       Impression and Plan: Stage III 3 nodes positive cancer of the cecum treated as outlined above. He remains free of any obvious recurrence now out 7 years from diagnosis. Plan continue annual followup.   CC:. Dr. Sheryn Bison; Deboraha Sprang at [facility] surgery   Levert Feinstein, MD 2/18/20139:25 PM

## 2011-12-22 ENCOUNTER — Ambulatory Visit: Payer: BC Managed Care – PPO | Admitting: Oncology

## 2011-12-23 ENCOUNTER — Other Ambulatory Visit: Payer: Self-pay | Admitting: Dermatology

## 2012-08-30 ENCOUNTER — Telehealth: Payer: Self-pay | Admitting: Oncology

## 2012-08-30 NOTE — Telephone Encounter (Signed)
Called pt and r/s appt from 2/17 to 2/5th lab and MD

## 2012-12-19 ENCOUNTER — Ambulatory Visit: Payer: BC Managed Care – PPO | Admitting: Oncology

## 2012-12-19 ENCOUNTER — Other Ambulatory Visit: Payer: BC Managed Care – PPO | Admitting: Lab

## 2012-12-27 ENCOUNTER — Other Ambulatory Visit (HOSPITAL_BASED_OUTPATIENT_CLINIC_OR_DEPARTMENT_OTHER): Payer: BC Managed Care – PPO | Admitting: Lab

## 2012-12-27 ENCOUNTER — Ambulatory Visit (HOSPITAL_BASED_OUTPATIENT_CLINIC_OR_DEPARTMENT_OTHER): Payer: BC Managed Care – PPO | Admitting: Oncology

## 2012-12-27 ENCOUNTER — Telehealth: Payer: Self-pay | Admitting: Oncology

## 2012-12-27 ENCOUNTER — Encounter: Payer: Self-pay | Admitting: Oncology

## 2012-12-27 VITALS — BP 138/81 | HR 71 | Temp 97.8°F | Resp 18 | Ht 71.0 in | Wt 194.0 lb

## 2012-12-27 DIAGNOSIS — Z85038 Personal history of other malignant neoplasm of large intestine: Secondary | ICD-10-CM

## 2012-12-27 LAB — COMPREHENSIVE METABOLIC PANEL (CC13)
Albumin: 3.6 g/dL (ref 3.5–5.0)
Alkaline Phosphatase: 106 U/L (ref 40–150)
BUN: 9.6 mg/dL (ref 7.0–26.0)
Calcium: 9.2 mg/dL (ref 8.4–10.4)
Creatinine: 1 mg/dL (ref 0.7–1.3)
Glucose: 99 mg/dl (ref 70–99)
Potassium: 4 mEq/L (ref 3.5–5.1)

## 2012-12-27 LAB — CBC WITH DIFFERENTIAL/PLATELET
BASO%: 0.8 % (ref 0.0–2.0)
HCT: 43.7 % (ref 38.4–49.9)
MCHC: 34.1 g/dL (ref 32.0–36.0)
MONO#: 0.5 10*3/uL (ref 0.1–0.9)
NEUT%: 62.7 % (ref 39.0–75.0)
WBC: 6.9 10*3/uL (ref 4.0–10.3)
lymph#: 1.9 10*3/uL (ref 0.9–3.3)

## 2012-12-27 LAB — CEA: CEA: <0.5 ng/mL (ref 0.0–5.0)

## 2012-12-27 NOTE — Progress Notes (Signed)
Hematology and Oncology Follow Up Visit  PEDER ALLUMS [id number] Jul 22, 1951 62 y.o. 12/27/2012 6:05 PM   Principle Diagnosis: Encounter Diagnosis  Name Primary?  . 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 Three nodes positive. Normal preoperative CEA. He was treated with surgical resection followed by 6 months of adjuvant FOLFIRI chemotherapy. He has had no signs of recurrence to date. He was followed with exams lab and CT scans for the first 5 years and currently just exams and lab.  He continues to do well. He has had no interim medical problems. Most recent colonoscopy done  on 04/20/2011 unremarkable.  He denies any abdominal pain or cramps no change in bowel habit no hematochezia or melena. He has had no interim medical problems.  Medications: reviewed  Allergies:  Allergies  Allergen Reactions  . Leucovorin Hives  . Formaldehyde Itching and Rash    Review of Systems: Constitutional:  No constitutional symptoms  Respiratory: No cough or dyspnea Cardiovascular:  No chest pain or palpitations Gastrointestinal: See above Genito-Urinary: Not questioned Musculoskeletal: No muscle or bone pain Neurologic: No headache or change in vision Skin: No rash Remaining ROS negative.  Physical Exam: Blood pressure 138/81, pulse 71, temperature 97.8 F (36.6 C), temperature source Oral, resp. rate 18, height 5\' 11"  (1.803 m), weight 194 lb (87.998 kg). Wt Readings from Last 3 Encounters:  12/27/12 194 lb (87.998 kg)  04/20/11 182 lb (82.555 kg)  04/06/11 190 lb 1.6 oz (86.229 kg)     General appearance: Thin Caucasian man HENNT: Pharynx no erythema or exudate Lymph nodes: No adenopathy Breasts: Lungs: Clear to auscultation resonant to percussion Heart: Regular rhythm no murmur Abdomen: Soft, nontender, well-healed  midline abdominal scar which is barely visible, no palpable mass or organomegaly Extremities: No edema, no calf tenderness Vascular: No cyanosis Neurologic: No focal deficit Skin: No rash  Lab Results: Lab Results  Component Value Date   WBC 6.9 12/27/2012   HGB 14.9 12/27/2012   HCT 43.7 12/27/2012   MCV 82.0 12/27/2012   PLT 177 12/27/2012     Chemistry      Component Value Date/Time   NA 141 12/27/2012 1330   NA 140 12/14/2011 0813   K 4.0 12/27/2012 1330   K 4.3 12/14/2011 0813   CL 104 12/27/2012 1330   CL 104 12/14/2011 0813   CO2 27 12/27/2012 1330   CO2 27 12/14/2011 0813   BUN 9.6 12/27/2012 1330   BUN 11 12/14/2011 0813   CREATININE 1.0 12/27/2012 1330   CREATININE 0.99 12/14/2011 0813      Component Value Date/Time   CALCIUM 9.2 12/27/2012 1330   CALCIUM 9.5 12/14/2011 0813   ALKPHOS 106 12/27/2012 1330   ALKPHOS 107 12/14/2011 0813   AST 24 12/27/2012 1330   AST 20 12/14/2011 0813   ALT 25 12/27/2012 1330   ALT 16 12/14/2011 0813   BILITOT 0.71 12/27/2012 1330   BILITOT 0.6 12/14/2011 0813    CEA undetectable  Impression and Plan: Stage III colon cancer treated as outlined above He remains in remission now out 8 years from diagnosis. Plan: Continue annual followup through year 10 then on a as-needed basis   CC:. Dr. Sheryn Bison;    Levert Feinstein, MD 2/25/20146:05 PM

## 2012-12-27 NOTE — Telephone Encounter (Signed)
Gave pt appt for lab and MD February 2015 °

## 2013-09-07 ENCOUNTER — Other Ambulatory Visit: Payer: Self-pay

## 2013-12-22 ENCOUNTER — Other Ambulatory Visit (HOSPITAL_BASED_OUTPATIENT_CLINIC_OR_DEPARTMENT_OTHER): Payer: BC Managed Care – PPO

## 2013-12-22 DIAGNOSIS — Z85038 Personal history of other malignant neoplasm of large intestine: Secondary | ICD-10-CM

## 2013-12-22 LAB — LACTATE DEHYDROGENASE (CC13): LDH: 192 U/L (ref 125–245)

## 2013-12-22 LAB — COMPREHENSIVE METABOLIC PANEL (CC13)
ALK PHOS: 107 U/L (ref 40–150)
ALT: 21 U/L (ref 0–55)
AST: 23 U/L (ref 5–34)
Albumin: 4.2 g/dL (ref 3.5–5.0)
Anion Gap: 9 mEq/L (ref 3–11)
BILIRUBIN TOTAL: 0.7 mg/dL (ref 0.20–1.20)
BUN: 13.9 mg/dL (ref 7.0–26.0)
CO2: 26 mEq/L (ref 22–29)
CREATININE: 0.9 mg/dL (ref 0.7–1.3)
Calcium: 9.7 mg/dL (ref 8.4–10.4)
Chloride: 104 mEq/L (ref 98–109)
GLUCOSE: 85 mg/dL (ref 70–140)
Potassium: 3.9 mEq/L (ref 3.5–5.1)
Sodium: 140 mEq/L (ref 136–145)
Total Protein: 7.5 g/dL (ref 6.4–8.3)

## 2013-12-22 LAB — CBC WITH DIFFERENTIAL/PLATELET
BASO%: 1 % (ref 0.0–2.0)
Basophils Absolute: 0.1 10*3/uL (ref 0.0–0.1)
EOS%: 1.7 % (ref 0.0–7.0)
Eosinophils Absolute: 0.1 10*3/uL (ref 0.0–0.5)
HCT: 45.4 % (ref 38.4–49.9)
HGB: 14.7 g/dL (ref 13.0–17.1)
LYMPH%: 25 % (ref 14.0–49.0)
MCH: 25.8 pg — AB (ref 27.2–33.4)
MCHC: 32.5 g/dL (ref 32.0–36.0)
MCV: 79.6 fL (ref 79.3–98.0)
MONO#: 0.5 10*3/uL (ref 0.1–0.9)
MONO%: 6.9 % (ref 0.0–14.0)
NEUT#: 5 10*3/uL (ref 1.5–6.5)
NEUT%: 65.4 % (ref 39.0–75.0)
PLATELETS: 199 10*3/uL (ref 140–400)
RBC: 5.7 10*6/uL (ref 4.20–5.82)
RDW: 15.4 % — ABNORMAL HIGH (ref 11.0–14.6)
WBC: 7.6 10*3/uL (ref 4.0–10.3)
lymph#: 1.9 10*3/uL (ref 0.9–3.3)

## 2013-12-23 LAB — CEA

## 2013-12-26 ENCOUNTER — Ambulatory Visit (HOSPITAL_BASED_OUTPATIENT_CLINIC_OR_DEPARTMENT_OTHER): Payer: BC Managed Care – PPO | Admitting: Oncology

## 2013-12-26 VITALS — BP 132/86 | HR 61 | Temp 97.6°F | Resp 18 | Ht 71.0 in | Wt 196.7 lb

## 2013-12-26 DIAGNOSIS — Z85038 Personal history of other malignant neoplasm of large intestine: Secondary | ICD-10-CM

## 2013-12-26 NOTE — Progress Notes (Signed)
Hematology and Oncology Follow Up Visit  Justin Francis [id number] 1951/08/04 62 y.o. 12/26/2013 1:26 PM   Principle Diagnosis:No diagnosis found.   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 Three nodes positive. Normal preoperative CEA. He was treated with surgical resection followed by 6 months of adjuvant FOLFIRI chemotherapy. He has had no signs of recurrence to date. He was followed with exams lab and CT scans for the first 5 years and currently just exams and lab.  He continues to do well. He has had no interim medical problems. Most recent colonoscopy done on 04/20/2011 unremarkable.  He denies any abdominal pain or cramps no change in bowel habit no hematochezia or melena. 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   Medications: reviewed  Allergies:  Allergies  Allergen Reactions  . Leucovorin Hives  . Formaldehyde Itching and Rash    Review of Systems: Hematology:  No bleeding or bruising ENT ROS: No sore throat Breast ROS:  Respiratory ROS: No cough or dyspnea Cardiovascular ROS:  No chest pain or palpitations Gastrointestinal ROS:   See above Genito-Urinary ROS: Not questioned Musculoskeletal ROS: No bone pain Neurological ROS: No headache or change in vision Dermatological ROS: See above Remaining ROS negative:   Physical Exam: Blood pressure 132/86, pulse 61, temperature 97.6 F (36.4 C), temperature source Oral, resp. rate 18, height 5\' 11"  (1.803 m), weight 196 lb 11.2 oz (89.223 kg), SpO2 98.00%. Wt Readings from Last 3 Encounters:  12/26/13 196 lb 11.2 oz (89.223 kg)  12/27/12 194 lb (87.998 kg)  04/20/11 182 lb (82.555 kg)     General appearance: Thin Caucasian man HENNT: Pharynx no erythema, exudate, mass, or ulcer. No thyromegaly or thyroid nodules Lymph nodes: No  cervical, supraclavicular, or axillary lymphadenopathy Breasts:  Lungs: Clear to auscultation, resonant to percussion throughout Heart: Regular rhythm, no murmur, no gallop, no rub, no click, no edema Abdomen: Soft, nontender, normal bowel sounds, no mass, no organomegaly Extremities: No edema, no calf tenderness Musculoskeletal: no joint deformities GU:  Vascular: Carotid pulses 2+, no bruits,  Neurologic: Alert, oriented, PERRLA,  , cranial nerves grossly normal, motor strength 5 over 5, reflexes 1+ symmetric, upper body coordination normal, gait normal, Skin: No rash or ecchymosis  Lab Results: CBC W/Diff    Component Value Date/Time   WBC 7.6 12/22/2013 1127   RBC 5.70 12/22/2013 1127   HGB 14.7 12/22/2013 1127   HCT 45.4 12/22/2013 1127   PLT 199 12/22/2013 1127   MCV 79.6 12/22/2013 1127   MCH 25.8* 12/22/2013 1127   MCH 29.0 12/02/2010 1434   MCHC 32.5 12/22/2013 1127   RDW 15.4* 12/22/2013 1127   LYMPHSABS 1.9 12/22/2013 1127   MONOABS 0.5 12/22/2013 1127   EOSABS 0.1 12/22/2013 1127   BASOSABS 0.1 12/22/2013 1127     Chemistry      Component Value Date/Time   NA 140 12/22/2013 1128   NA 140 12/14/2011 0813   K 3.9 12/22/2013 1128   K 4.3 12/14/2011 0813   CL 104 12/27/2012 1330   CL 104 12/14/2011 0813   CO2 26 12/22/2013 1128   CO2 27 12/14/2011 0813   BUN 13.9 12/22/2013 1128   BUN 11 12/14/2011 0813   CREATININE 0.9 12/22/2013 1128   CREATININE 0.99 12/14/2011 0813      Component Value Date/Time   CALCIUM  9.7 12/22/2013 1128   CALCIUM 9.5 12/14/2011 0813   ALKPHOS 107 12/22/2013 1128   ALKPHOS 107 12/14/2011 0813   AST 23 12/22/2013 1128   AST 20 12/14/2011 0813   ALT 21 12/22/2013 1128   ALT 16 12/14/2011 0813   BILITOT 0.70 12/22/2013 1128   BILITOT 0.6 12/14/2011 0813       Impression:   Stage III colon cancer treated as outlined above  He remains in remission now out 9 years from diagnosis.  I told him he could graduate from our practice at this time. I gave him my  contact number at [facility] If he needs to come back to the oncology practice in the future, I gave him Dr. Gearldine Shown  name.   CC: Patient Care Team: Aretta Nip, MD as PCP - General (Family Medicine)   Annia Belt, MD 2/24/20151:26 PM

## 2014-11-08 ENCOUNTER — Encounter: Payer: Self-pay | Admitting: Gastroenterology

## 2016-03-05 ENCOUNTER — Encounter: Payer: Self-pay | Admitting: Internal Medicine

## 2016-04-28 ENCOUNTER — Ambulatory Visit (AMBULATORY_SURGERY_CENTER): Payer: Self-pay

## 2016-04-28 VITALS — Ht 71.0 in | Wt 199.4 lb

## 2016-04-28 DIAGNOSIS — Z85038 Personal history of other malignant neoplasm of large intestine: Secondary | ICD-10-CM

## 2016-04-28 MED ORDER — NA SULFATE-K SULFATE-MG SULF 17.5-3.13-1.6 GM/177ML PO SOLN: ORAL | Status: DC

## 2016-04-28 NOTE — Progress Notes (Signed)
Per pt, no allergies to soy or egg products.Pt not taking any weight loss meds or using  O2 at home. 

## 2016-05-19 ENCOUNTER — Encounter: Payer: Self-pay | Admitting: Internal Medicine

## 2016-05-19 ENCOUNTER — Ambulatory Visit (AMBULATORY_SURGERY_CENTER): Payer: BLUE CROSS/BLUE SHIELD | Admitting: Internal Medicine

## 2016-05-19 VITALS — BP 110/80 | HR 66 | Temp 97.5°F | Resp 21 | Ht 71.0 in | Wt 196.0 lb

## 2016-05-19 DIAGNOSIS — Z1211 Encounter for screening for malignant neoplasm of colon: Secondary | ICD-10-CM | POA: Diagnosis not present

## 2016-05-19 DIAGNOSIS — D124 Benign neoplasm of descending colon: Secondary | ICD-10-CM

## 2016-05-19 DIAGNOSIS — D125 Benign neoplasm of sigmoid colon: Secondary | ICD-10-CM | POA: Diagnosis not present

## 2016-05-19 DIAGNOSIS — Z85038 Personal history of other malignant neoplasm of large intestine: Secondary | ICD-10-CM

## 2016-05-19 MED ORDER — SODIUM CHLORIDE 0.9 % IV SOLN: 500.0000 mL | INTRAVENOUS | Status: DC

## 2016-05-19 NOTE — Progress Notes (Signed)
Called to room to assist during endoscopic procedure.  Patient ID and intended procedure confirmed with present staff. Received instructions for my participation in the procedure from the performing physician.  

## 2016-05-19 NOTE — Patient Instructions (Addendum)
YOU HAD AN ENDOSCOPIC PROCEDURE TODAY AT [facility]:   Refer to the procedure report that was given to you for any specific questions about what was found during the examination.  If the procedure report does not answer your questions, please call your gastroenterologist to clarify.  If you requested that your care partner not be given the details of your procedure findings, then the procedure report has been included in a sealed envelope for you to review at your convenience later.  YOU SHOULD EXPECT: Some feelings of bloating in the abdomen. Passage of more gas than usual.  Walking can help get rid of the air that was put into your GI tract during the procedure and reduce the bloating. If you had a lower endoscopy (such as a colonoscopy or flexible sigmoidoscopy) you may notice spotting of blood in your stool or on the toilet paper. If you underwent a bowel prep for your procedure, you may not have a normal bowel movement for a few days.  Please Note:  You might notice some irritation and congestion in your nose or some drainage.  This is from the oxygen used during your procedure.  There is no need for concern and it should clear up in a day or so.  SYMPTOMS TO REPORT IMMEDIATELY:   Following lower endoscopy (colonoscopy or flexible sigmoidoscopy):  Excessive amounts of blood in the stool  Significant tenderness or worsening of abdominal pains  Swelling of the abdomen that is new, acute  Fever of 100F or higher   For urgent or emergent issues, a gastroenterologist can be reached at any hour by calling [phone].   DIET: Your first meal following the procedure should be a small meal and then it is ok to progress to your normal diet. Heavy or fried foods are harder to digest and may make you feel nauseous or bloated.  Likewise, meals heavy in dairy and vegetables can increase bloating.  Drink plenty of fluids but you should avoid alcoholic beverages for 24 hours. Try to  increase the fiber in your diet, and drink plenty of water.  ACTIVITY:  You should plan to take it easy for the rest of today and you should NOT DRIVE or use heavy machinery until tomorrow (because of the sedation medicines used during the test).    FOLLOW UP: Our staff will call the number listed on your records the next business day following your procedure to check on you and address any questions or concerns that you may have regarding the information given to you following your procedure. If we do not reach you, we will leave a message.  However, if you are feeling well and you are not experiencing any problems, there is no need to return our call.  We will assume that you have returned to your regular daily activities without incident.  Read all of the handouts given to you by your recovery room nurse.  Thank-you for choosing [place] for your healthcare needs today.  If any biopsies were taken you will be contacted by phone or by letter within the next 1-3 weeks.  Please call us at [phone] if you have not heard about the biopsies in 3 weeks.    SIGNATURES/CONFIDENTIALITY: You and/or your care partner have signed paperwork which will be entered into your electronic medical record.  These signatures attest to the fact that that the information above on your After Visit Summary has been reviewed and is understood.  Full responsibility of  the confidentiality of this discharge information lies with you and/or your care-partner. 

## 2016-05-19 NOTE — Progress Notes (Signed)
Report to PACU, RN, vss, BBS= Clear.  

## 2016-05-19 NOTE — Op Note (Signed)
[place] Patient Name: Justin Francis Procedure Date: 05/19/2016 8:51 AM MRN: [id number] Endoscopist: Jerene Bears , MD Age: 65 Referring MD:  Date of Birth: [birth year] Gender: Male Account #: [id number] Procedure:                Colonoscopy Indications:              High risk colon cancer surveillance: Personal                            history of colon cancer (2006), Last colonoscopy 5                            years ago Medicines:                Monitored Anesthesia Care Procedure:                Pre-Anesthesia Assessment:                           - Prior to the procedure, a History and Physical                            was performed, and patient medications and                            allergies were reviewed. The patient's tolerance of                            previous anesthesia was also reviewed. The risks                            and benefits of the procedure and the sedation                            options and risks were discussed with the patient.                            All questions were answered, and informed consent                            was obtained. Prior Anticoagulants: The patient has                            taken no previous anticoagulant or antiplatelet                            agents. ASA Grade Assessment: II - A patient with                            mild systemic disease. After reviewing the risks                            and benefits, the patient was deemed in  satisfactory condition to undergo the procedure.                           After obtaining informed consent, the colonoscope                            was passed under direct vision. Throughout the                            procedure, the patient's blood pressure, pulse, and                            oxygen saturations were monitored continuously. The                            Model CF-[id number] scope was introduced                     through the anus and advanced to the the                            ileocolonic anastomosis. The colonoscopy was                            performed without difficulty. The patient tolerated                            the procedure well. The quality of the bowel                            preparation was good. The terminal ileum, ileocecal                            valve, appendiceal orifice, and rectum were                            photographed. Scope In: 8:56:38 AM Scope Out: 9:12:00 AM Scope Withdrawal Time: 0 hours 12 minutes 14 seconds  Total Procedure Duration: 0 hours 15 minutes 22 seconds  Findings:                 The digital rectal exam was normal.                           The neo-terminal ileum appeared normal.                           Two sessile polyps were found in the sigmoid colon                            and descending colon. The polyps were 5 to 7 mm in                            size. These polyps were removed with a cold snare.  Resection and retrieval were complete.                           Multiple small and large-mouthed diverticula were                            found in the sigmoid colon and descending colon.                           Internal hemorrhoids and a skin tag were found                            during retroflexion. The hemorrhoids were small. Complications:            No immediate complications. Estimated Blood Loss:     Estimated blood loss was minimal. Impression:               - The examined portion of the ileum was normal.                           - Two 5 to 7 mm polyps in the sigmoid colon and in                            the descending colon, removed with a cold snare.                            Resected and retrieved.                           - Moderate diverticulosis in the sigmoid colon and                            in the descending colon.                           - Internal  hemorrhoids. Recommendation:           - Patient has a contact number available for                            emergencies. The signs and symptoms of potential                            delayed complications were discussed with the                            patient. Return to normal activities tomorrow.                            Written discharge instructions were provided to the                            patient.                           - Resume previous diet.                           -  Continue present medications.                           - Await pathology results.                           - Repeat colonoscopy in 5 years for surveillance                            based on personal history of colon cancer. Jerene Bears, MD 05/19/2016 9:17:33 AM This report has been signed electronically.

## 2016-05-20 ENCOUNTER — Telehealth: Payer: Self-pay

## 2016-05-20 NOTE — Telephone Encounter (Signed)
  Follow up Call-  Call back number 05/19/2016  Post procedure Call Back phone  # [phone]  Permission to leave phone message Yes     Patient questions:  Do you have a fever, pain , or abdominal swelling? No. Pain Score  0 *  Have you tolerated food without any problems? Yes.    Have you been able to return to your normal activities? Yes.    Do you have any questions about your discharge instructions: Diet   No. Medications  No. Follow up visit  No.  Do you have questions or concerns about your Care? No.  Actions: * If pain score is 4 or above: No action needed, pain <4.

## 2016-05-21 ENCOUNTER — Encounter: Payer: Self-pay | Admitting: Internal Medicine

## 2017-05-25 DIAGNOSIS — Z0000 Encounter for general adult medical examination without abnormal findings: Secondary | ICD-10-CM | POA: Diagnosis not present

## 2017-05-25 DIAGNOSIS — Z125 Encounter for screening for malignant neoplasm of prostate: Secondary | ICD-10-CM | POA: Diagnosis not present

## 2017-05-25 DIAGNOSIS — Z131 Encounter for screening for diabetes mellitus: Secondary | ICD-10-CM | POA: Diagnosis not present

## 2017-05-25 DIAGNOSIS — Z136 Encounter for screening for cardiovascular disorders: Secondary | ICD-10-CM | POA: Diagnosis not present

## 2017-06-11 ENCOUNTER — Encounter: Payer: Self-pay | Admitting: *Deleted

## 2017-07-15 ENCOUNTER — Ambulatory Visit: Payer: BLUE CROSS/BLUE SHIELD | Admitting: Internal Medicine

## 2017-07-20 ENCOUNTER — Ambulatory Visit: Payer: BLUE CROSS/BLUE SHIELD | Admitting: Internal Medicine

## 2017-07-21 ENCOUNTER — Ambulatory Visit: Payer: Self-pay | Admitting: Internal Medicine

## 2017-07-22 ENCOUNTER — Encounter: Payer: Self-pay | Admitting: Nurse Practitioner

## 2017-07-22 ENCOUNTER — Ambulatory Visit (INDEPENDENT_AMBULATORY_CARE_PROVIDER_SITE_OTHER): Payer: Medicare Other | Admitting: Nurse Practitioner

## 2017-07-22 VITALS — BP 140/70 | HR 78 | Ht 71.0 in | Wt 200.0 lb

## 2017-07-22 DIAGNOSIS — K625 Hemorrhage of anus and rectum: Secondary | ICD-10-CM

## 2017-07-22 DIAGNOSIS — K648 Other hemorrhoids: Secondary | ICD-10-CM

## 2017-07-22 NOTE — Patient Instructions (Signed)
If you are age 65 or older, your body mass index should be between 23-30. Your Body mass index is 27.89 kg/m. If this is out of the aforementioned range listed, please consider follow up with your Primary Care Provider.  If you are age 17 or younger, your body mass index should be between 19-25. Your Body mass index is 27.89 kg/m. If this is out of the aformentioned range listed, please consider follow up with your Primary Care Provider.   Make appointment with Dr. Hilarie Fredrickson for hemorrhoid banding.  Schedule not available at this time.  Will call with an appointment when schedule becomes available.  Thank you for choosing me and [facility].   Tye Savoy, NP

## 2017-07-22 NOTE — Progress Notes (Addendum)
     HPI: Patient is a 66 year old male known to Dr. Hilarie Fredrickson. He has a history of stage III adenocarcinoma of the cecum diagnosed in 2006. Three nodes positive. He is status post resection followed by 6 months of adjuvant chemotherapy.  2 small tubular adenomas without high-grade dysplasia were removed from: At time of last surveillance colonoscopy July 2017. Also had diverticulosis and internal hemorrhoids. Dr. Hilarie Fredrickson told patient that he could band internal hemorrhoids if desired. Since his colonoscopy patient has had a couple of episodes of bleeding, he would like to proceed with hemorrhoidal banding.  Past medical history has been updated, no changes since last seen in the office. Patient is on a baby aspirin but no anticoagulants. He feels well overall   Past Medical History:  Diagnosis Date  . Cancer [facility])    colon cancer 2006/had chemo for 6 months  . Diverticulosis   . Hemorrhoids   . History of colon cancer, stage III 12/21/2011  . Tubular adenoma of colon     Patient's surgical history, family medical history, social history, medications and allergies were all reviewed in Epic    Physical Exam: BP 140/70   Pulse 78   Ht 5\' 11"  (1.803 m)   Wt 200 lb (90.7 kg)   BMI 27.89 kg/m   GENERAL:  Well developed white male in NAD PSYCH: :Pleasant, cooperative, normal affect EENT:  conjunctiva pink, mucous membranes moist, neck supple without masses CARDIAC:  RRR,  No murmur heard, no peripheral edema PULM: Normal respiratory effort, lungs CTA bilaterally, no wheezing NEURO: Alert and oriented x 3, no focal neurologic deficits   ASSESSMENT and PLAN:  Pleasant 66 year old male with intermittent rectal bleeding. He has known internal hemorrhoids. He would like to proceed with banding as discussed with Dr. Hilarie Fredrickson at time of last colonoscopy in July 2017.  His medical history has been updated, no new medical problems since last visit. He takes a baby aspirin, no anticoagulants -I  gave him a pamphlet on [facility] banding. Recommended website for more details. -will make him an appt with Dr. Hilarie Fredrickson for banding   Tye Savoy , NP 07/22/2017, 2:01 PM   Addendum: Reviewed and agree with management. Pyrtle, Lajuan Lines, MD

## 2017-07-29 ENCOUNTER — Telehealth: Payer: Self-pay | Admitting: Nurse Practitioner

## 2017-07-29 NOTE — Telephone Encounter (Signed)
Scheduled for November

## 2017-08-26 DIAGNOSIS — Z23 Encounter for immunization: Secondary | ICD-10-CM | POA: Diagnosis not present

## 2017-09-08 ENCOUNTER — Ambulatory Visit: Payer: Medicare Other | Admitting: Gastroenterology

## 2017-09-08 ENCOUNTER — Encounter: Payer: Self-pay | Admitting: Gastroenterology

## 2017-09-08 VITALS — BP 146/78 | HR 80 | Ht 71.0 in | Wt 203.0 lb

## 2017-09-08 DIAGNOSIS — K641 Second degree hemorrhoids: Secondary | ICD-10-CM | POA: Diagnosis not present

## 2017-09-08 NOTE — Patient Instructions (Signed)

## 2017-09-08 NOTE — Progress Notes (Signed)
PROCEDURE NOTE: The patient presents with symptomatic grade II  hemorrhoids, requesting rubber band ligation of his/her hemorrhoidal disease.  All risks, benefits and alternative forms of therapy were described and informed consent was obtained.  In the Left Lateral Decubitus position anoscopic examination revealed grade II hemorrhoids in the right posterior and left lateral; grade I hemorrhoids in the right anterior position(s).  The anorectum was pre-medicated with 0.125% nitroglycerine and recticare The decision was made to band the Right posterior internal hemorrhoid, and the [place] was used to perform band ligation without complication.  Digital anorectal examination was then performed to assure proper positioning of the band, and to adjust the banded tissue as required.  The patient was discharged home without pain or other issues.  Dietary and behavioral recommendations were given and along with follow-up instructions.     The following adjunctive treatments were recommended: Fiber supplements daily  The patient will return in 2-4 weeks for  follow-up and possible additional banding as required. No complications were encountered and the patient tolerated the procedure well.  Damaris Hippo , MD [phone] Mon-Fri 8a-5p [phone] after 5p, weekends, holidays

## 2017-09-09 DIAGNOSIS — C4441 Basal cell carcinoma of skin of scalp and neck: Secondary | ICD-10-CM | POA: Diagnosis not present

## 2017-09-09 DIAGNOSIS — Z85828 Personal history of other malignant neoplasm of skin: Secondary | ICD-10-CM | POA: Diagnosis not present

## 2017-09-09 DIAGNOSIS — D225 Melanocytic nevi of trunk: Secondary | ICD-10-CM | POA: Diagnosis not present

## 2017-09-09 DIAGNOSIS — D1801 Hemangioma of skin and subcutaneous tissue: Secondary | ICD-10-CM | POA: Diagnosis not present

## 2017-09-09 DIAGNOSIS — L821 Other seborrheic keratosis: Secondary | ICD-10-CM | POA: Diagnosis not present

## 2017-10-05 ENCOUNTER — Encounter: Payer: Medicare Other | Admitting: Internal Medicine

## 2017-11-05 ENCOUNTER — Encounter: Payer: Self-pay | Admitting: *Deleted

## 2017-11-16 ENCOUNTER — Ambulatory Visit: Payer: Medicare Other | Admitting: Internal Medicine

## 2017-11-16 ENCOUNTER — Encounter: Payer: Self-pay | Admitting: Internal Medicine

## 2017-11-16 VITALS — BP 114/68 | HR 76 | Ht 71.0 in | Wt 203.0 lb

## 2017-11-16 DIAGNOSIS — K648 Other hemorrhoids: Secondary | ICD-10-CM

## 2017-11-16 NOTE — Patient Instructions (Signed)
You have been scheduled for your 3rd hemorrhoidal banding on 12/03/17 at 345 pm with Dr Hilarie Fredrickson.  HEMORRHOID BANDING PROCEDURE    FOLLOW-UP CARE   1. The procedure you have had should have been relatively painless since the banding of the area involved does not have nerve endings and there is no pain sensation.  The rubber band cuts off the blood supply to the hemorrhoid and the band may fall off as soon as 48 hours after the banding (the band may occasionally be seen in the toilet bowl following a bowel movement). You may notice a temporary feeling of fullness in the rectum which should respond adequately to plain Tylenol or Motrin.  2. Following the banding, avoid strenuous exercise that evening and resume full activity the next day.  A sitz bath (soaking in a warm tub) or bidet is soothing, and can be useful for cleansing the area after bowel movements.     3. To avoid constipation, take two tablespoons of natural wheat bran, natural oat bran, flax, Benefiber or any over the counter fiber supplement and increase your water intake to 7-8 glasses daily.    4. Unless you have been prescribed anorectal medication, do not put anything inside your rectum for two weeks: No suppositories, enemas, fingers, etc.  5. Occasionally, you may have more bleeding than usual after the banding procedure.  This is often from the untreated hemorrhoids rather than the treated one.  Don't be concerned if there is a tablespoon or so of blood.  If there is more blood than this, lie flat with your bottom higher than your head and apply an ice pack to the area. If the bleeding does not stop within a half an hour or if you feel faint, call our office at [phone] or go to the emergency room.  6. Problems are not common; however, if there is a substantial amount of bleeding, severe pain, chills, fever or difficulty passing urine (very rare) or other problems, you should call us at [phone] or report to the  nearest emergency room.  7. Do not stay seated continuously for more than 2-3 hours for a day or two after the procedure.  Tighten your buttock muscles 10-15 times every two hours and take 10-15 deep breaths every 1-2 hours.  Do not spend more than a few minutes on the toilet if you cannot empty your bowel; instead re-visit the toilet at a later time.

## 2017-11-16 NOTE — Progress Notes (Signed)
Justin Francis is a 67 year old male with a history of colon cancer status post resection, colon polyps, internal hemorrhoids who returns for additional hemorrhoidal banding  He was inadvertently scheduled with Dr. Silverio Decamp for his first hemorrhoidal banding which was performed on 09/08/2017. Symptoms before banding included intermittent rectal bleeding as well as some prolapse symptoms. Anoscopy prior to initial banding showed grade 2 hemorrhoids in the right posterior and left lateral position, grade 1 in the right anterior position He had a right posterior hemorrhoid banded first  He reports excellent response with no further rectal bleeding.  He wishes to continue banding protocol   PROCEDURE NOTE:  The patient presents with symptomatic grade 2 internal hemorrhoids, requesting rubber band ligation of his hemorrhoidal disease.  All risks, benefits and alternative forms of therapy were described and informed consent was obtained.   The anorectum was pre-medicated with 0.125% nitroglycerin ointment The decision was made to band the LL internal hemorrhoid, and the [place] was used to perform band ligation without complication.   Digital anorectal examination was then performed to assure proper positioning of the band, and to adjust the banded tissue as required.  The patient was discharged home without pain or other issues.  Dietary and behavioral recommendations were given and along with follow-up instructions.      The patient will return as scheduled for  follow-up and possible additional banding as required. No complications were encountered and the patient tolerated the procedure well.

## 2017-12-03 ENCOUNTER — Encounter: Payer: Self-pay | Admitting: Internal Medicine

## 2017-12-03 ENCOUNTER — Ambulatory Visit: Payer: Medicare Other | Admitting: Internal Medicine

## 2017-12-03 VITALS — BP 110/72 | HR 64 | Ht 71.0 in | Wt 201.1 lb

## 2017-12-03 DIAGNOSIS — K648 Other hemorrhoids: Secondary | ICD-10-CM | POA: Diagnosis not present

## 2017-12-03 NOTE — Patient Instructions (Signed)
Please follow up as needed.  HEMORRHOID BANDING PROCEDURE    FOLLOW-UP CARE   1. The procedure you have had should have been relatively painless since the banding of the area involved does not have nerve endings and there is no pain sensation.  The rubber band cuts off the blood supply to the hemorrhoid and the band may fall off as soon as 48 hours after the banding (the band may occasionally be seen in the toilet bowl following a bowel movement). You may notice a temporary feeling of fullness in the rectum which should respond adequately to plain Tylenol or Motrin.  2. Following the banding, avoid strenuous exercise that evening and resume full activity the next day.  A sitz bath (soaking in a warm tub) or bidet is soothing, and can be useful for cleansing the area after bowel movements.     3. To avoid constipation, take two tablespoons of natural wheat bran, natural oat bran, flax, Benefiber or any over the counter fiber supplement and increase your water intake to 7-8 glasses daily.    4. Unless you have been prescribed anorectal medication, do not put anything inside your rectum for two weeks: No suppositories, enemas, fingers, etc.  5. Occasionally, you may have more bleeding than usual after the banding procedure.  This is often from the untreated hemorrhoids rather than the treated one.  Don't be concerned if there is a tablespoon or so of blood.  If there is more blood than this, lie flat with your bottom higher than your head and apply an ice pack to the area. If the bleeding does not stop within a half an hour or if you feel faint, call our office at [phone] or go to the emergency room.  6. Problems are not common; however, if there is a substantial amount of bleeding, severe pain, chills, fever or difficulty passing urine (very rare) or other problems, you should call us at [phone] or report to the nearest emergency room.  7. Do not stay seated continuously for more  than 2-3 hours for a day or two after the procedure.  Tighten your buttock muscles 10-15 times every two hours and take 10-15 deep breaths every 1-2 hours.  Do not spend more than a few minutes on the toilet if you cannot empty your bowel; instead re-visit the toilet at a later time.     

## 2017-12-03 NOTE — Progress Notes (Signed)
Justin Francis is a 67 year old male with a history of colon cancer status post resection, colon polyps and internal hemorrhoids who is here for additional hemorrhoidal band  He had hemorrhoidal banding on 09/08/17 and 11/16/17 Symptoms prior to banding included rectal bleeding as well as frequent hemorrhoidal prolapse symptoms He reports he is doing great with previous banding's and has had no further rectal bleeding or prolapse symptoms.  He is very happy with response and wishes he had dealt with these sooner.   PROCEDURE NOTE:  The patient presents with symptomatic grade 2 internal hemorrhoids, requesting rubber band ligation of his hemorrhoidal disease.  All risks, benefits and alternative forms of therapy were described and informed consent was obtained.   The anorectum was pre-medicated with 0.125% nitroglycerin ointment The decision was made to band the RP internal hemorrhoid, and the [place] was used to perform band ligation without complication.   Digital anorectal examination was then performed to assure proper positioning of the band, and to adjust the banded tissue as required.  The patient was discharged home without pain or other issues.  Dietary and behavioral recommendations were given and along with follow-up instructions.    The patient will return as needed for follow-up and possible additional banding as required. No complications were encountered and the patient tolerated the procedure well.

## 2018-03-31 DIAGNOSIS — H524 Presbyopia: Secondary | ICD-10-CM | POA: Diagnosis not present

## 2018-08-03 DIAGNOSIS — Z9109 Other allergy status, other than to drugs and biological substances: Secondary | ICD-10-CM | POA: Diagnosis not present

## 2018-08-03 DIAGNOSIS — Z23 Encounter for immunization: Secondary | ICD-10-CM | POA: Diagnosis not present

## 2018-08-03 DIAGNOSIS — Z0000 Encounter for general adult medical examination without abnormal findings: Secondary | ICD-10-CM | POA: Diagnosis not present

## 2018-08-03 DIAGNOSIS — E782 Mixed hyperlipidemia: Secondary | ICD-10-CM | POA: Diagnosis not present

## 2018-09-15 DIAGNOSIS — L821 Other seborrheic keratosis: Secondary | ICD-10-CM | POA: Diagnosis not present

## 2018-09-15 DIAGNOSIS — L570 Actinic keratosis: Secondary | ICD-10-CM | POA: Diagnosis not present

## 2018-09-15 DIAGNOSIS — Z85828 Personal history of other malignant neoplasm of skin: Secondary | ICD-10-CM | POA: Diagnosis not present

## 2019-03-21 DIAGNOSIS — L03311 Cellulitis of abdominal wall: Secondary | ICD-10-CM | POA: Diagnosis not present

## 2019-03-21 DIAGNOSIS — S30861A Insect bite (nonvenomous) of abdominal wall, initial encounter: Secondary | ICD-10-CM | POA: Diagnosis not present

## 2019-04-11 DIAGNOSIS — H524 Presbyopia: Secondary | ICD-10-CM | POA: Diagnosis not present

## 2019-05-16 DIAGNOSIS — Z20828 Contact with and (suspected) exposure to other viral communicable diseases: Secondary | ICD-10-CM | POA: Diagnosis not present

## 2019-08-16 DIAGNOSIS — E782 Mixed hyperlipidemia: Secondary | ICD-10-CM | POA: Diagnosis not present

## 2019-08-16 DIAGNOSIS — Z125 Encounter for screening for malignant neoplasm of prostate: Secondary | ICD-10-CM | POA: Diagnosis not present

## 2019-08-16 DIAGNOSIS — Z0000 Encounter for general adult medical examination without abnormal findings: Secondary | ICD-10-CM | POA: Diagnosis not present

## 2019-08-18 DIAGNOSIS — Z0000 Encounter for general adult medical examination without abnormal findings: Secondary | ICD-10-CM | POA: Diagnosis not present

## 2019-08-18 DIAGNOSIS — E782 Mixed hyperlipidemia: Secondary | ICD-10-CM | POA: Diagnosis not present

## 2019-08-18 DIAGNOSIS — Z125 Encounter for screening for malignant neoplasm of prostate: Secondary | ICD-10-CM | POA: Diagnosis not present

## 2019-08-18 DIAGNOSIS — Z131 Encounter for screening for diabetes mellitus: Secondary | ICD-10-CM | POA: Diagnosis not present

## 2019-09-20 DIAGNOSIS — D1801 Hemangioma of skin and subcutaneous tissue: Secondary | ICD-10-CM | POA: Diagnosis not present

## 2019-09-20 DIAGNOSIS — L821 Other seborrheic keratosis: Secondary | ICD-10-CM | POA: Diagnosis not present

## 2019-09-20 DIAGNOSIS — Z85828 Personal history of other malignant neoplasm of skin: Secondary | ICD-10-CM | POA: Diagnosis not present

## 2019-09-20 DIAGNOSIS — D2272 Melanocytic nevi of left lower limb, including hip: Secondary | ICD-10-CM | POA: Diagnosis not present

## 2019-11-11 DIAGNOSIS — H524 Presbyopia: Secondary | ICD-10-CM | POA: Diagnosis not present

## 2019-12-11 ENCOUNTER — Ambulatory Visit: Payer: Medicare Other | Attending: Internal Medicine

## 2019-12-11 DIAGNOSIS — Z23 Encounter for immunization: Secondary | ICD-10-CM | POA: Insufficient documentation

## 2019-12-11 NOTE — Progress Notes (Signed)
   Covid-19 Vaccination Clinic  Name:  Justin Francis    MRN: [id number] DOB: 05-Mar-1951  12/11/2019  [name] was observed post Covid-19 immunization for 15 minutes without incidence. He was provided with Vaccine Information Sheet and instruction to access the V-Safe system.   [name] was instructed to call 911 with any severe reactions post vaccine: Marland Kitchen Difficulty breathing  . Swelling of your face and throat  . A fast heartbeat  . A bad rash all over your body  . Dizziness and weakness    Immunizations Administered    Name Date Dose VIS Date Route   Pfizer COVID-19 Vaccine 12/11/2019  3:25 PM 0.3 mL 10/13/2019 Intramuscular   Manufacturer: [place]   Lot: [id number]   [place]: [id number]

## 2020-01-05 ENCOUNTER — Ambulatory Visit: Payer: Medicare Other | Attending: Internal Medicine

## 2020-01-05 DIAGNOSIS — Z23 Encounter for immunization: Secondary | ICD-10-CM

## 2020-01-05 NOTE — Progress Notes (Signed)
   Covid-19 Vaccination Clinic  Name:  Justin Francis    MRN: [id number] DOB: 17-Jun-1951  01/05/2020  [name] was observed post Covid-19 immunization for 15 minutes without incident. He was provided with Vaccine Information Sheet and instruction to access the V-Safe system.   [name] was instructed to call 911 with any severe reactions post vaccine: Marland Kitchen Difficulty breathing  . Swelling of face and throat  . A fast heartbeat  . A bad rash all over body  . Dizziness and weakness   Immunizations Administered    Name Date Dose VIS Date Route   Pfizer COVID-19 Vaccine 01/05/2020 11:52 AM 0.3 mL 10/13/2019 Intramuscular   Manufacturer: [place]   Lot: [id number]   [place]: [id number]

## 2020-02-04 NOTE — Progress Notes (Signed)
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 He underwent hemorrhoid banding x  09/2017, 11/2017 and 12/2017 which significantly reduced his rectal bleeding. He presents today concerned with recurrence of rectal bleeding which occurred one day last week. He reported seeing a moderate amount of bright red blood on the stool and on the toilet tissue. He had some anal/hemorrhoidal soreness for the next day. No obvious prolapsed hemorrhoids. No further rectal bleeding since then. He denies straining or constipation. He takes psyllium daily. His most recent colonoscopy was 05/19/2016, two tubular adenomatous polyps were removed from the sigmoid and descending colon, moderate diverticulosis and internal hemorrhoids were noted. Weight has been stable. He donates 1 unit of PRBCs every 8 weeks. He stated his Hg was 16 on 01/17/2020 at the time of his last blood donation. He takes ASA 81mg  daily.   He received his COVID 19 vaccination on 12/11/2019  and 01/05/2020  Colonoscopy 05/19/2016 by Dr. Hilarie Fredrickson: - The examined portion of the ileum was normal. - Two 5 to 7 mm tubular adenomatous polyps in the sigmoid colon and in the descending colon were removed.  - Moderate diverticulosis in the sigmoid colon and in the descending colon. - Internal hemorrhoids. - 5 year recall.   Current Outpatient Medications on File Prior to Visit  Medication Sig Dispense Refill  . aspirin 81 MG tablet Take 81 mg by mouth daily.    Marland Kitchen atorvastatin (LIPITOR) 10 MG tablet Take 10 mg by mouth daily.    . fish oil-omega-3 fatty acids 1000 MG capsule Take 1 g by mouth daily.      . Multiple Vitamin (MULTIVITAMIN) tablet Take 1 tablet by mouth daily.      . Psyllium (KONSYL)  100 % PACK Take by mouth. 1 tsp. Twice a day      No current facility-administered medications on file prior to visit.    Allergies  Allergen Reactions  . Vit B3-Azelac-Turm-Fa-B6-Zn-Cu [B Complex-C-E-Zn]     Rash and hives  . Leucovorin Hives  . Formaldehyde Itching and Rash    Current Medications, Allergies, Past Medical History, Past Surgical History, Family History and Social History were reviewed in [employer] record.   Physical Exam: BP 136/70   Pulse 68   Temp 98.2 F (36.8 C)   Ht 5\' 11"  (1.803 m)   Wt 204 lb 9.6 oz (92.8 kg)   BMI 28.54 kg/m  General: Well developed 69 year old male in no acute distress. Head: Normocephalic and atraumatic. Eyes: No scleral icterus. Conjunctiva pink . Ears: Normal auditory acuity. Mouth: Dentition intact. No ulcers or lesions.  Lungs: Clear throughout to auscultation. Heart: Regular rate and rhythm, no murmur. Abdomen: Soft, nontender and nondistended. No masses or hepatomegaly. Normal bowel sounds x 4 quadrants.  Rectal: No prolapsed hemorrhoids. Internal hemorrhoids palpated, no blood or stool in the rectal vault. No mass. Olivia Mackie CMA present during exam.  Musculoskeletal: Symmetrical with no gross deformities. Extremities: No edema. Neurological: Alert oriented x 4. No focal deficits.  Psychological: Alert and cooperative. Normal mood and affect  Assessment and Recommendations:  25. 69 year old male with a history of stage III adenocarcinoma of the cecum s/p resection and chemo in  2006 with rectal bleeding. Known internal hemorrhoids with successful band ligation x 3  2018-2019. -I discussed scheduling a colonoscopy due to recurrence of rectal bleeding, to ensure bleeding is not above hemorrhoids. Await further recommendations Dr. Hilarie Fredrickson.  -Colonoscopy benefits and risks discussed including risk with sedation, risk of bleeding, perforation and infection  -Consider hemorrhoid banding after colonoscopy  completed -CBC -Discussed Desitin or Preparation H for hemorrhoidal irritation and bleeding  -Continue fiber supplement daily  -Patient to call our office if his rectal bleeding worsens.

## 2020-02-05 ENCOUNTER — Ambulatory Visit: Payer: Medicare Other | Admitting: Nurse Practitioner

## 2020-02-05 ENCOUNTER — Other Ambulatory Visit (INDEPENDENT_AMBULATORY_CARE_PROVIDER_SITE_OTHER): Payer: Medicare Other

## 2020-02-05 ENCOUNTER — Encounter: Payer: Self-pay | Admitting: Nurse Practitioner

## 2020-02-05 VITALS — BP 136/70 | HR 68 | Temp 98.2°F | Ht 71.0 in | Wt 204.6 lb

## 2020-02-05 DIAGNOSIS — Z800 Family history of malignant neoplasm of digestive organs: Secondary | ICD-10-CM

## 2020-02-05 DIAGNOSIS — K648 Other hemorrhoids: Secondary | ICD-10-CM

## 2020-02-05 DIAGNOSIS — K625 Hemorrhage of anus and rectum: Secondary | ICD-10-CM | POA: Insufficient documentation

## 2020-02-05 LAB — CBC
HCT: 41.5 % (ref 39.0–52.0)
Hemoglobin: 13.5 g/dL (ref 13.0–17.0)
MCHC: 32.4 g/dL (ref 30.0–36.0)
MCV: 74.4 fl — ABNORMAL LOW (ref 78.0–100.0)
Platelets: 213 10*3/uL (ref 150.0–400.0)
RBC: 5.59 Mil/uL (ref 4.22–5.81)
RDW: 18.8 % — ABNORMAL HIGH (ref 11.5–15.5)
WBC: 8.7 10*3/uL (ref 4.0–10.5)

## 2020-02-05 MED ORDER — NA SULFATE-K SULFATE-MG SULF 17.5-3.13-1.6 GM/177ML PO SOLN: 1.0000 | Freq: Once | ORAL | 0 refills | Status: AC

## 2020-02-05 NOTE — Patient Instructions (Signed)
If you are age 68 or older, your body mass index should be between 23-30. Your Body mass index is 28.54 kg/m. If this is out of the aforementioned range listed, please consider follow up with your Primary Care Provider.  If you are age 53 or younger, your body mass index should be between 19-25. Your Body mass index is 28.54 kg/m. If this is out of the aformentioned range listed, please consider follow up with your Primary Care Provider.   Your provider has requested that you go to the basement level for lab work before leaving today. Press "B" on the elevator. The lab is located at the first door on the left as you exit the elevator.  We have sent the following medications to your pharmacy for you to pick up at your convenience:  suprep   Due to recent changes in healthcare laws, you may see the results of your imaging and laboratory studies on MyChart before your provider has had a chance to review them.  We understand that in some cases there may be results that are confusing or concerning to you. Not all laboratory results come back in the same time frame and the provider may be waiting for multiple results in order to interpret others.  Please give [place] 48 hours in order for your provider to thoroughly review all the results before contacting the office for clarification of your results.

## 2020-02-07 NOTE — Progress Notes (Signed)
Addendum: Reviewed and agree with assessment and management plan. Hilberto Burzynski M, MD  

## 2020-02-26 ENCOUNTER — Encounter: Payer: Self-pay | Admitting: Internal Medicine

## 2020-02-26 ENCOUNTER — Other Ambulatory Visit: Payer: Self-pay

## 2020-02-26 ENCOUNTER — Ambulatory Visit (AMBULATORY_SURGERY_CENTER): Payer: Medicare Other | Admitting: Internal Medicine

## 2020-02-26 VITALS — BP 123/81 | HR 66 | Temp 97.1°F | Resp 19 | Ht 71.0 in | Wt 204.0 lb

## 2020-02-26 DIAGNOSIS — K625 Hemorrhage of anus and rectum: Secondary | ICD-10-CM

## 2020-02-26 DIAGNOSIS — K648 Other hemorrhoids: Secondary | ICD-10-CM

## 2020-02-26 DIAGNOSIS — K5730 Diverticulosis of large intestine without perforation or abscess without bleeding: Secondary | ICD-10-CM

## 2020-02-26 DIAGNOSIS — D123 Benign neoplasm of transverse colon: Secondary | ICD-10-CM | POA: Diagnosis not present

## 2020-02-26 DIAGNOSIS — Z1211 Encounter for screening for malignant neoplasm of colon: Secondary | ICD-10-CM | POA: Diagnosis not present

## 2020-02-26 DIAGNOSIS — Z85038 Personal history of other malignant neoplasm of large intestine: Secondary | ICD-10-CM

## 2020-02-26 MED ORDER — SODIUM CHLORIDE 0.9 % IV SOLN: 500.0000 mL | Freq: Once | INTRAVENOUS | Status: DC

## 2020-02-26 NOTE — Op Note (Signed)
[facility] Patient Name: Justin Francis Procedure Date: 02/26/2020 2:43 PM MRN: [id number] Endoscopist: Jerene Bears , MD Age: 69 Referring MD:  Date of Birth: [birth year] Gender: Male Account #: [id number] Procedure:                Colonoscopy Indications:              Rectal bleeding in patient with history of                            hemorrhoids and prior hemorrhoidal banding,                            Personal history of malignant neoplasm of the colon                            (2006), Personal history of colonic polyps, last                            colonoscopy 4 years ago Medicines:                Monitored Anesthesia Care Procedure:                Pre-Anesthesia Assessment:                           - Prior to the procedure, a History and Physical                            was performed, and patient medications and                            allergies were reviewed. The patient's tolerance of                            previous anesthesia was also reviewed. The risks                            and benefits of the procedure and the sedation                            options and risks were discussed with the patient.                            All questions were answered, and informed consent                            was obtained. Prior Anticoagulants: The patient has                            taken no previous anticoagulant or antiplatelet                            agents. ASA Grade Assessment: II - A patient with  mild systemic disease. After reviewing the risks                            and benefits, the patient was deemed in                            satisfactory condition to undergo the procedure.                           After obtaining informed consent, the colonoscope                            was passed under direct vision. Throughout the                            procedure, the patient's blood pressure, pulse, and                        oxygen saturations were monitored continuously. The                            Colonoscope was introduced through the anus and                            advanced to the terminal ileum. The colonoscopy was                            performed without difficulty. The patient tolerated                            the procedure well. The quality of the bowel                            preparation was good. The terminal ileum, ileocecal                            valve, appendiceal orifice, and rectum were                            photographed. Scope In: 2:55:43 PM Scope Out: 3:13:14 PM Scope Withdrawal Time: 0 hours 14 minutes 46 seconds  Total Procedure Duration: 0 hours 17 minutes 31 seconds  Findings:                 The digital rectal exam was normal.                           There was evidence of a prior end-to-side                            ileo-colonic anastomosis in the ascending colon.                            This was patent and was characterized by healthy  appearing mucosa.                           The neo-terminal ileum appeared normal.                           Two sessile polyps were found in the transverse                            colon. The polyps were 3 to 4 mm in size. These                            polyps were removed with a cold biopsy forceps.                            Resection and retrieval were complete.                           Multiple small and large-mouthed diverticula were                            found in the sigmoid colon and descending colon.                           Internal hemorrhoids were found during                            retroflexion. The hemorrhoids were small. There was                            evidence of post-banding scars in the distal rectum. Complications:            No immediate complications. Estimated Blood Loss:     Estimated blood loss was minimal. Impression:                - Patent end-to-side ileo-colonic anastomosis,                            characterized by healthy appearing mucosa.                           - The examined portion of the ileum was normal.                           - Two 3 to 4 mm polyps in the transverse colon,                            removed with a cold biopsy forceps. Resected and                            retrieved.                           - Diverticulosis in the sigmoid colon and in the  descending colon.                           - Small internal hemorrhoids with evidence of prior                            banding (scarring) in the distal rectum. Recommendation:           - Patient has a contact number available for                            emergencies. The signs and symptoms of potential                            delayed complications were discussed with the                            patient. Return to normal activities tomorrow.                            Written discharge instructions were provided to the                            patient.                           - Resume previous diet.                           - Continue present medications.                           - Await pathology results.                           - If hemorrhoidal bleeding recurs on a regular                            basis, then repeat banding can be performed.                           - Repeat colonoscopy is recommended for                            surveillance, likely in 5 years. The colonoscopy                            date will be determined after pathology results                            from today's exam become available for review. Jerene Bears, MD 02/26/2020 3:19:36 PM This report has been signed electronically.

## 2020-02-26 NOTE — Progress Notes (Signed)
A and O x3. Report to RN. Tolerated MAC anesthesia well.

## 2020-02-26 NOTE — Progress Notes (Signed)
Called to room to assist during endoscopic procedure.  Patient ID and intended procedure confirmed with present staff. Received instructions for my participation in the procedure from the performing physician.  

## 2020-02-26 NOTE — Patient Instructions (Signed)
Information on polyps, diverticulosis and hemorrhoids given to you today.  Await pathology results.  If hemorrhoidal bleeding recurs on a regular basis, then repeat banding can be performed.   YOU HAD AN ENDOSCOPIC PROCEDURE TODAY AT [facility]:   Refer to the procedure report that was given to you for any specific questions about what was found during the examination.  If the procedure report does not answer your questions, please call your gastroenterologist to clarify.  If you requested that your care partner not be given the details of your procedure findings, then the procedure report has been included in a sealed envelope for you to review at your convenience later.  YOU SHOULD EXPECT: Some feelings of bloating in the abdomen. Passage of more gas than usual.  Walking can help get rid of the air that was put into your GI tract during the procedure and reduce the bloating. If you had a lower endoscopy (such as a colonoscopy or flexible sigmoidoscopy) you may notice spotting of blood in your stool or on the toilet paper. If you underwent a bowel prep for your procedure, you may not have a normal bowel movement for a few days.  Please Note:  You might notice some irritation and congestion in your nose or some drainage.  This is from the oxygen used during your procedure.  There is no need for concern and it should clear up in a day or so.  SYMPTOMS TO REPORT IMMEDIATELY:   Following lower endoscopy (colonoscopy or flexible sigmoidoscopy):  Excessive amounts of blood in the stool  Significant tenderness or worsening of abdominal pains  Swelling of the abdomen that is new, acute  Fever of 100F or higher   For urgent or emergent issues, a gastroenterologist can be reached at any hour by calling [phone]. Do not use MyChart messaging for urgent concerns.    DIET:  We do recommend a small meal at first, but then you may proceed to your regular diet.  Drink plenty of  fluids but you should avoid alcoholic beverages for 24 hours.  ACTIVITY:  You should plan to take it easy for the rest of today and you should NOT DRIVE or use heavy machinery until tomorrow (because of the sedation medicines used during the test).    FOLLOW UP: Our staff will call the number listed on your records 48-72 hours following your procedure to check on you and address any questions or concerns that you may have regarding the information given to you following your procedure. If we do not reach you, we will leave a message.  We will attempt to reach you two times.  During this call, we will ask if you have developed any symptoms of COVID 19. If you develop any symptoms (ie: fever, flu-like symptoms, shortness of breath, cough etc.) before then, please call [phone].  If you test positive for Covid 19 in the 2 weeks post procedure, please call and report this information to [place].    If any biopsies were taken you will be contacted by phone or by letter within the next 1-3 weeks.  Please call us at [phone] if you have not heard about the biopsies in 3 weeks.    SIGNATURES/CONFIDENTIALITY: You and/or your care partner have signed paperwork which will be entered into your electronic medical record.  These signatures attest to the fact that that the information above on your After Visit Summary has been reviewed and is understood.  Full responsibility  of the confidentiality of this discharge information lies with you and/or your care-partner.

## 2020-02-28 ENCOUNTER — Telehealth: Payer: Self-pay | Admitting: *Deleted

## 2020-02-28 NOTE — Telephone Encounter (Signed)
  Follow up Call-  Call back number 02/26/2020  Post procedure Call Back phone  # [phone] hm  Permission to leave phone message Yes  Some recent data might be hidden     Patient questions:  Do you have a fever, pain , or abdominal swelling? No. Pain Score  0 *  Have you tolerated food without any problems? Yes.    Have you been able to return to your normal activities? Yes.    Do you have any questions about your discharge instructions: Diet   No. Medications  No. Follow up visit  No.  Do you have questions or concerns about your Care? No.  Actions: * If pain score is 4 or above: No action needed, pain <4.  1. Have you developed a fever since your procedure? no  2.   Have you had an respiratory symptoms (SOB or cough) since your procedure? no  3.   Have you tested positive for COVID 19 since your procedure no  4.   Have you had any family members/close contacts diagnosed with the COVID 19 since your procedure?  no   If yes to any of these questions please route to Joylene John, RN and Erenest Rasher, RN

## 2020-02-29 ENCOUNTER — Encounter: Payer: Self-pay | Admitting: Internal Medicine

## 2020-04-11 DIAGNOSIS — H524 Presbyopia: Secondary | ICD-10-CM | POA: Diagnosis not present

## 2020-08-22 DIAGNOSIS — Z131 Encounter for screening for diabetes mellitus: Secondary | ICD-10-CM | POA: Diagnosis not present

## 2020-08-22 DIAGNOSIS — E782 Mixed hyperlipidemia: Secondary | ICD-10-CM | POA: Diagnosis not present

## 2020-08-22 DIAGNOSIS — Z0000 Encounter for general adult medical examination without abnormal findings: Secondary | ICD-10-CM | POA: Diagnosis not present

## 2020-08-22 DIAGNOSIS — Z125 Encounter for screening for malignant neoplasm of prostate: Secondary | ICD-10-CM | POA: Diagnosis not present

## 2020-08-29 DIAGNOSIS — L02224 Furuncle of groin: Secondary | ICD-10-CM | POA: Diagnosis not present

## 2020-09-07 DIAGNOSIS — R509 Fever, unspecified: Secondary | ICD-10-CM | POA: Diagnosis not present

## 2020-09-07 DIAGNOSIS — R21 Rash and other nonspecific skin eruption: Secondary | ICD-10-CM | POA: Diagnosis not present

## 2020-09-07 DIAGNOSIS — T370X5A Adverse effect of sulfonamides, initial encounter: Secondary | ICD-10-CM | POA: Diagnosis not present

## 2020-09-24 DIAGNOSIS — D2261 Melanocytic nevi of right upper limb, including shoulder: Secondary | ICD-10-CM | POA: Diagnosis not present

## 2020-09-24 DIAGNOSIS — Z85828 Personal history of other malignant neoplasm of skin: Secondary | ICD-10-CM | POA: Diagnosis not present

## 2020-09-24 DIAGNOSIS — L821 Other seborrheic keratosis: Secondary | ICD-10-CM | POA: Diagnosis not present

## 2020-09-24 DIAGNOSIS — D225 Melanocytic nevi of trunk: Secondary | ICD-10-CM | POA: Diagnosis not present

## 2021-01-27 DIAGNOSIS — L817 Pigmented purpuric dermatosis: Secondary | ICD-10-CM | POA: Diagnosis not present

## 2021-01-27 DIAGNOSIS — L821 Other seborrheic keratosis: Secondary | ICD-10-CM | POA: Diagnosis not present

## 2021-01-27 DIAGNOSIS — Z85828 Personal history of other malignant neoplasm of skin: Secondary | ICD-10-CM | POA: Diagnosis not present

## 2021-04-21 DIAGNOSIS — H524 Presbyopia: Secondary | ICD-10-CM | POA: Diagnosis not present

## 2021-09-02 DIAGNOSIS — E782 Mixed hyperlipidemia: Secondary | ICD-10-CM | POA: Diagnosis not present

## 2021-09-02 DIAGNOSIS — Z125 Encounter for screening for malignant neoplasm of prostate: Secondary | ICD-10-CM | POA: Diagnosis not present

## 2021-09-02 DIAGNOSIS — Z23 Encounter for immunization: Secondary | ICD-10-CM | POA: Diagnosis not present

## 2021-09-02 DIAGNOSIS — Z0000 Encounter for general adult medical examination without abnormal findings: Secondary | ICD-10-CM | POA: Diagnosis not present

## 2021-09-04 DIAGNOSIS — D1801 Hemangioma of skin and subcutaneous tissue: Secondary | ICD-10-CM | POA: Diagnosis not present

## 2021-09-04 DIAGNOSIS — D485 Neoplasm of uncertain behavior of skin: Secondary | ICD-10-CM | POA: Diagnosis not present

## 2021-09-04 DIAGNOSIS — C44319 Basal cell carcinoma of skin of other parts of face: Secondary | ICD-10-CM | POA: Diagnosis not present

## 2021-09-04 DIAGNOSIS — Z85828 Personal history of other malignant neoplasm of skin: Secondary | ICD-10-CM | POA: Diagnosis not present

## 2021-09-04 DIAGNOSIS — L821 Other seborrheic keratosis: Secondary | ICD-10-CM | POA: Diagnosis not present

## 2022-02-27 DIAGNOSIS — U071 COVID-19: Secondary | ICD-10-CM | POA: Diagnosis not present

## 2022-04-24 DIAGNOSIS — H5212 Myopia, left eye: Secondary | ICD-10-CM | POA: Diagnosis not present

## 2022-06-28 DIAGNOSIS — Z0100 Encounter for examination of eyes and vision without abnormal findings: Secondary | ICD-10-CM | POA: Diagnosis not present

## 2022-09-04 DIAGNOSIS — Z9109 Other allergy status, other than to drugs and biological substances: Secondary | ICD-10-CM | POA: Diagnosis not present

## 2022-09-04 DIAGNOSIS — Z6826 Body mass index (BMI) 26.0-26.9, adult: Secondary | ICD-10-CM | POA: Diagnosis not present

## 2022-09-04 DIAGNOSIS — Z85038 Personal history of other malignant neoplasm of large intestine: Secondary | ICD-10-CM | POA: Diagnosis not present

## 2022-09-04 DIAGNOSIS — Z0000 Encounter for general adult medical examination without abnormal findings: Secondary | ICD-10-CM | POA: Diagnosis not present

## 2022-09-04 DIAGNOSIS — E782 Mixed hyperlipidemia: Secondary | ICD-10-CM | POA: Diagnosis not present

## 2022-09-04 DIAGNOSIS — Z125 Encounter for screening for malignant neoplasm of prostate: Secondary | ICD-10-CM | POA: Diagnosis not present

## 2022-09-09 DIAGNOSIS — D225 Melanocytic nevi of trunk: Secondary | ICD-10-CM | POA: Diagnosis not present

## 2022-09-09 DIAGNOSIS — Z85828 Personal history of other malignant neoplasm of skin: Secondary | ICD-10-CM | POA: Diagnosis not present

## 2022-09-09 DIAGNOSIS — L821 Other seborrheic keratosis: Secondary | ICD-10-CM | POA: Diagnosis not present

## 2022-09-09 DIAGNOSIS — D2262 Melanocytic nevi of left upper limb, including shoulder: Secondary | ICD-10-CM | POA: Diagnosis not present

## 2022-11-16 DIAGNOSIS — K080 Exfoliation of teeth due to systemic causes: Secondary | ICD-10-CM | POA: Diagnosis not present

## 2023-02-25 DIAGNOSIS — R7309 Other abnormal glucose: Secondary | ICD-10-CM | POA: Diagnosis not present

## 2023-02-25 DIAGNOSIS — Z79899 Other long term (current) drug therapy: Secondary | ICD-10-CM | POA: Diagnosis not present

## 2023-02-25 DIAGNOSIS — E782 Mixed hyperlipidemia: Secondary | ICD-10-CM | POA: Diagnosis not present

## 2023-02-25 DIAGNOSIS — I10 Essential (primary) hypertension: Secondary | ICD-10-CM | POA: Diagnosis not present

## 2023-03-18 DIAGNOSIS — L905 Scar conditions and fibrosis of skin: Secondary | ICD-10-CM | POA: Diagnosis not present

## 2023-03-18 DIAGNOSIS — L821 Other seborrheic keratosis: Secondary | ICD-10-CM | POA: Diagnosis not present

## 2023-03-18 DIAGNOSIS — Z85828 Personal history of other malignant neoplasm of skin: Secondary | ICD-10-CM | POA: Diagnosis not present

## 2023-04-05 DIAGNOSIS — Z6827 Body mass index (BMI) 27.0-27.9, adult: Secondary | ICD-10-CM | POA: Diagnosis not present

## 2023-04-05 DIAGNOSIS — I10 Essential (primary) hypertension: Secondary | ICD-10-CM | POA: Diagnosis not present

## 2023-04-05 DIAGNOSIS — R7303 Prediabetes: Secondary | ICD-10-CM | POA: Diagnosis not present

## 2023-05-04 DIAGNOSIS — H524 Presbyopia: Secondary | ICD-10-CM | POA: Diagnosis not present

## 2023-05-17 DIAGNOSIS — I10 Essential (primary) hypertension: Secondary | ICD-10-CM | POA: Diagnosis not present

## 2023-05-17 DIAGNOSIS — E782 Mixed hyperlipidemia: Secondary | ICD-10-CM | POA: Diagnosis not present

## 2023-09-06 DIAGNOSIS — H524 Presbyopia: Secondary | ICD-10-CM | POA: Diagnosis not present

## 2023-09-13 DIAGNOSIS — Z131 Encounter for screening for diabetes mellitus: Secondary | ICD-10-CM | POA: Diagnosis not present

## 2023-09-13 DIAGNOSIS — E782 Mixed hyperlipidemia: Secondary | ICD-10-CM | POA: Diagnosis not present

## 2023-09-13 DIAGNOSIS — I10 Essential (primary) hypertension: Secondary | ICD-10-CM | POA: Diagnosis not present

## 2023-09-13 DIAGNOSIS — L821 Other seborrheic keratosis: Secondary | ICD-10-CM | POA: Diagnosis not present

## 2023-09-13 DIAGNOSIS — Z23 Encounter for immunization: Secondary | ICD-10-CM | POA: Diagnosis not present

## 2023-09-13 DIAGNOSIS — Z85828 Personal history of other malignant neoplasm of skin: Secondary | ICD-10-CM | POA: Diagnosis not present

## 2023-09-13 DIAGNOSIS — C44619 Basal cell carcinoma of skin of left upper limb, including shoulder: Secondary | ICD-10-CM | POA: Diagnosis not present

## 2023-09-13 DIAGNOSIS — D225 Melanocytic nevi of trunk: Secondary | ICD-10-CM | POA: Diagnosis not present

## 2023-09-13 DIAGNOSIS — Z125 Encounter for screening for malignant neoplasm of prostate: Secondary | ICD-10-CM | POA: Diagnosis not present

## 2023-09-13 DIAGNOSIS — R7303 Prediabetes: Secondary | ICD-10-CM | POA: Diagnosis not present

## 2023-09-13 DIAGNOSIS — Z0000 Encounter for general adult medical examination without abnormal findings: Secondary | ICD-10-CM | POA: Diagnosis not present

## 2024-07-21 DIAGNOSIS — H524 Presbyopia: Secondary | ICD-10-CM | POA: Diagnosis not present

## 2024-09-13 DIAGNOSIS — L820 Inflamed seborrheic keratosis: Secondary | ICD-10-CM | POA: Diagnosis not present

## 2024-09-13 DIAGNOSIS — D225 Melanocytic nevi of trunk: Secondary | ICD-10-CM | POA: Diagnosis not present

## 2024-09-13 DIAGNOSIS — L821 Other seborrheic keratosis: Secondary | ICD-10-CM | POA: Diagnosis not present

## 2024-09-13 DIAGNOSIS — Z85828 Personal history of other malignant neoplasm of skin: Secondary | ICD-10-CM | POA: Diagnosis not present

## 2024-09-13 DIAGNOSIS — L570 Actinic keratosis: Secondary | ICD-10-CM | POA: Diagnosis not present

## 2024-09-13 DIAGNOSIS — D2262 Melanocytic nevi of left upper limb, including shoulder: Secondary | ICD-10-CM | POA: Diagnosis not present

## 2024-09-19 DIAGNOSIS — Z0000 Encounter for general adult medical examination without abnormal findings: Secondary | ICD-10-CM | POA: Diagnosis not present

## 2024-09-19 DIAGNOSIS — I10 Essential (primary) hypertension: Secondary | ICD-10-CM | POA: Diagnosis not present

## 2024-09-19 DIAGNOSIS — E559 Vitamin D deficiency, unspecified: Secondary | ICD-10-CM | POA: Diagnosis not present

## 2024-09-19 DIAGNOSIS — R7303 Prediabetes: Secondary | ICD-10-CM | POA: Diagnosis not present

## 2024-09-19 DIAGNOSIS — Z23 Encounter for immunization: Secondary | ICD-10-CM | POA: Diagnosis not present

## 2024-09-19 DIAGNOSIS — Z1331 Encounter for screening for depression: Secondary | ICD-10-CM | POA: Diagnosis not present

## 2024-09-19 DIAGNOSIS — E782 Mixed hyperlipidemia: Secondary | ICD-10-CM | POA: Diagnosis not present
